# Patient Record
Sex: Male | Born: 1995 | Race: Black or African American | Hispanic: No | Marital: Single | Smoking: Former smoker
Health system: Southern US, Community
[De-identification: ages and names within clinical notes are randomized; demographics above are authoritative.]

## PROBLEM LIST (undated history)

## (undated) DIAGNOSIS — Z720 Tobacco use: Secondary | ICD-10-CM

---
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
## 2017-10-28 ENCOUNTER — Emergency Department (HOSPITAL_COMMUNITY): Payer: No Typology Code available for payment source

## 2017-10-28 ENCOUNTER — Encounter (HOSPITAL_COMMUNITY): Payer: Self-pay | Admitting: Emergency Medicine

## 2017-10-28 ENCOUNTER — Emergency Department (HOSPITAL_COMMUNITY)
Admission: EM | Admit: 2017-10-28 | Discharge: 2017-10-29 | Disposition: A | Discharge status: Home / Self Care | Payer: No Typology Code available for payment source | Attending: Emergency Medicine | Admitting: Emergency Medicine

## 2017-10-28 ENCOUNTER — Other Ambulatory Visit: Payer: Self-pay

## 2017-10-28 DIAGNOSIS — F17210 Nicotine dependence, cigarettes, uncomplicated: Secondary | ICD-10-CM | POA: Insufficient documentation

## 2017-10-28 DIAGNOSIS — R0781 Pleurodynia: Secondary | ICD-10-CM | POA: Diagnosis present

## 2017-10-28 NOTE — ED Notes (Signed)
Pt eating poptarts in waiting room.
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
## 2018-03-06 ENCOUNTER — Encounter (HOSPITAL_COMMUNITY): Payer: Self-pay | Admitting: Emergency Medicine

## 2018-03-06 DIAGNOSIS — F1290 Cannabis use, unspecified, uncomplicated: Secondary | ICD-10-CM | POA: Diagnosis present

## 2018-03-06 DIAGNOSIS — S7401XA Injury of sciatic nerve at hip and thigh level, right leg, initial encounter: Secondary | ICD-10-CM | POA: Diagnosis present

## 2018-03-06 DIAGNOSIS — S32461A Displaced associated transverse-posterior fracture of right acetabulum, initial encounter for closed fracture: Secondary | ICD-10-CM | POA: Diagnosis present

## 2018-03-06 DIAGNOSIS — F1910 Other psychoactive substance abuse, uncomplicated: Secondary | ICD-10-CM | POA: Diagnosis present

## 2018-03-06 DIAGNOSIS — F17210 Nicotine dependence, cigarettes, uncomplicated: Secondary | ICD-10-CM | POA: Diagnosis present

## 2018-03-06 DIAGNOSIS — S32401A Unspecified fracture of right acetabulum, initial encounter for closed fracture: Secondary | ICD-10-CM

## 2018-03-06 DIAGNOSIS — M21371 Foot drop, right foot: Secondary | ICD-10-CM | POA: Diagnosis present

## 2018-03-06 DIAGNOSIS — M87251 Osteonecrosis due to previous trauma, right femur: Secondary | ICD-10-CM | POA: Diagnosis present

## 2018-03-06 DIAGNOSIS — Y92410 Unspecified street and highway as the place of occurrence of the external cause: Secondary | ICD-10-CM

## 2018-03-06 DIAGNOSIS — D62 Acute posthemorrhagic anemia: Secondary | ICD-10-CM | POA: Diagnosis present

## 2018-03-06 DIAGNOSIS — S73014A Posterior dislocation of right hip, initial encounter: Secondary | ICD-10-CM | POA: Diagnosis present

## 2018-03-06 HISTORY — PX: ORIF ACETABULAR FRACTURE: SHX5029

## 2018-03-07 ENCOUNTER — Encounter (HOSPITAL_COMMUNITY): Payer: Self-pay | Admitting: Orthopedic Surgery

## 2018-03-07 DIAGNOSIS — S32461A Displaced associated transverse-posterior fracture of right acetabulum, initial encounter for closed fracture: Secondary | ICD-10-CM | POA: Insufficient documentation

## 2018-03-07 DIAGNOSIS — S32401A Unspecified fracture of right acetabulum, initial encounter for closed fracture: Secondary | ICD-10-CM | POA: Insufficient documentation

## 2018-03-07 DIAGNOSIS — F17210 Nicotine dependence, cigarettes, uncomplicated: Secondary | ICD-10-CM | POA: Insufficient documentation

## 2018-03-07 DIAGNOSIS — S73004A Unspecified dislocation of right hip, initial encounter: Secondary | ICD-10-CM | POA: Insufficient documentation

## 2018-03-07 DIAGNOSIS — Z79899 Other long term (current) drug therapy: Secondary | ICD-10-CM | POA: Insufficient documentation

## 2018-03-10 ENCOUNTER — Encounter: Payer: Self-pay | Admitting: Radiation Oncology

## 2018-03-11 ENCOUNTER — Encounter (HOSPITAL_COMMUNITY): Payer: Self-pay | Admitting: Orthopedic Surgery

## 2018-03-13 ENCOUNTER — Encounter (HOSPITAL_COMMUNITY): Payer: Self-pay | Admitting: Orthopedic Surgery

## 2018-03-25 ENCOUNTER — Encounter (HOSPITAL_COMMUNITY): Payer: Self-pay

## 2018-03-25 DIAGNOSIS — Z5320 Procedure and treatment not carried out because of patient's decision for unspecified reasons: Secondary | ICD-10-CM | POA: Insufficient documentation

## 2018-03-25 DIAGNOSIS — G8911 Acute pain due to trauma: Secondary | ICD-10-CM | POA: Insufficient documentation

## 2018-03-25 DIAGNOSIS — F17210 Nicotine dependence, cigarettes, uncomplicated: Secondary | ICD-10-CM | POA: Insufficient documentation

## 2018-03-26 DIAGNOSIS — G8918 Other acute postprocedural pain: Secondary | ICD-10-CM | POA: Insufficient documentation

## 2018-03-26 DIAGNOSIS — Z4802 Encounter for removal of sutures: Secondary | ICD-10-CM | POA: Insufficient documentation

## 2018-03-26 DIAGNOSIS — R2241 Localized swelling, mass and lump, right lower limb: Secondary | ICD-10-CM | POA: Insufficient documentation

## 2021-08-19 ENCOUNTER — Emergency Department (HOSPITAL_COMMUNITY)
Admission: EM | Admit: 2021-08-19 | Discharge: 2021-08-19 | Disposition: A | Discharge status: Home / Self Care | Payer: Medicaid Other | Attending: Emergency Medicine | Admitting: Emergency Medicine

## 2021-08-19 ENCOUNTER — Encounter (HOSPITAL_COMMUNITY): Payer: Self-pay

## 2021-08-19 ENCOUNTER — Other Ambulatory Visit: Payer: Self-pay

## 2021-08-19 DIAGNOSIS — Z20822 Contact with and (suspected) exposure to covid-19: Secondary | ICD-10-CM | POA: Diagnosis not present

## 2021-08-19 DIAGNOSIS — M549 Dorsalgia, unspecified: Secondary | ICD-10-CM | POA: Diagnosis present

## 2021-08-19 DIAGNOSIS — R1110 Vomiting, unspecified: Secondary | ICD-10-CM | POA: Diagnosis not present

## 2021-08-19 DIAGNOSIS — Z5321 Procedure and treatment not carried out due to patient leaving prior to being seen by health care provider: Secondary | ICD-10-CM | POA: Insufficient documentation

## 2021-08-19 LAB — COMPREHENSIVE METABOLIC PANEL WITH GFR
ALT: 15 U/L (ref 0–44)
AST: 22 U/L (ref 15–41)
Albumin: 3.7 g/dL (ref 3.5–5.0)
Alkaline Phosphatase: 68 U/L (ref 38–126)
Anion gap: 6 (ref 5–15)
BUN: 12 mg/dL (ref 6–20)
CO2: 25 mmol/L (ref 22–32)
Calcium: 8.8 mg/dL — ABNORMAL LOW (ref 8.9–10.3)
Chloride: 106 mmol/L (ref 98–111)
Creatinine, Ser: 1.32 mg/dL — ABNORMAL HIGH (ref 0.61–1.24)
GFR, Estimated: >60 mL/min (ref >60)
Glucose, Bld: 119 mg/dL — ABNORMAL HIGH (ref 70–99)
Potassium: 4 mmol/L (ref 3.5–5.1)
Sodium: 137 mmol/L (ref 135–145)
Total Bilirubin: 0.5 mg/dL (ref 0.3–1.2)
Total Protein: 7.1 g/dL (ref 6.5–8.1)

## 2021-08-19 LAB — CBC WITH DIFFERENTIAL/PLATELET
Abs Immature Granulocytes: 0.02 K/uL (ref 0.00–0.07)
Basophils Absolute: 0 K/uL (ref 0.0–0.1)
Basophils Relative: 0 %
Eosinophils Absolute: 0 K/uL (ref 0.0–0.5)
Eosinophils Relative: 0 %
HCT: 47.4 % (ref 39.0–52.0)
Hemoglobin: 15.9 g/dL (ref 13.0–17.0)
Immature Granulocytes: 0 %
Lymphocytes Relative: 13 %
Lymphs Abs: 0.7 K/uL (ref 0.7–4.0)
MCH: 31.2 pg (ref 26.0–34.0)
MCHC: 33.5 g/dL (ref 30.0–36.0)
MCV: 92.9 fL (ref 80.0–100.0)
Monocytes Absolute: 0.6 K/uL (ref 0.1–1.0)
Monocytes Relative: 11 %
Neutro Abs: 4.1 K/uL (ref 1.7–7.7)
Neutrophils Relative %: 76 %
Platelets: 144 K/uL — ABNORMAL LOW (ref 150–400)
RBC: 5.1 MIL/uL (ref 4.22–5.81)
RDW: 11.9 % (ref 11.5–15.5)
WBC: 5.4 K/uL (ref 4.0–10.5)
nRBC: 0 % (ref 0.0–0.2)

## 2021-08-19 LAB — URINALYSIS, ROUTINE W REFLEX MICROSCOPIC
Bacteria, UA: NONE SEEN
Bilirubin Urine: NEGATIVE
Glucose, UA: NEGATIVE mg/dL
Ketones, ur: NEGATIVE mg/dL
Leukocytes,Ua: NEGATIVE
Nitrite: NEGATIVE
Protein, ur: NEGATIVE mg/dL
Specific Gravity, Urine: 1.014 (ref 1.005–1.030)
pH: 5 (ref 5.0–8.0)

## 2021-08-19 LAB — LACTIC ACID, PLASMA: Lactic Acid, Venous: 0.6 mmol/L (ref 0.5–1.9)

## 2021-08-19 LAB — RESP PANEL BY RT-PCR (FLU A&B, COVID) ARPGX2
Influenza A by PCR: NEGATIVE
Influenza B by PCR: NEGATIVE
SARS Coronavirus 2 by RT PCR: NEGATIVE

## 2021-08-19 MED ORDER — ACETAMINOPHEN 500 MG PO TABS
1000.0000 mg | ORAL_TABLET | Freq: Once | ORAL | Status: AC
  Administered 2021-08-19: 1000 mg via ORAL
  Filled 2021-08-19: qty 2

## 2021-08-19 NOTE — ED Notes (Signed)
Pt didn't answer when called for recheck vitals x2

## 2021-08-19 NOTE — ED Triage Notes (Signed)
Pt c/o back x 3-4 days, vomiting started this am.

## 2021-08-19 NOTE — ED Notes (Signed)
Pt didn't answer when called to recheck vitals  

## 2021-08-19 NOTE — ED Provider Notes (Signed)
Emergency Medicine Provider Triage Evaluation Note  Todd Mclaughlin , a 25 y.o. male  was evaluated in triage.  Pt complains of back pain and vomiting.  States pain has been severe in the lower back over the past 3 days.  He denies injury/trauma/fall.  Today pain seemed worse, has had some vomiting.  Found to be febrile in triage.  Denies known sick contacts or covid exposures.  No cough or URI symptoms.  Denies any generalized body aches, states only his back.  No hx of cancer or IVDU.  No hematuria or dysuria.  Review of Systems  Positive: Back pain, vomiting, fever Negative: Diarrhea, cough, chest pain, SOB  Physical Exam  BP 119/82 (BP Location: Left Arm)   Pulse 98   Temp (!) 101.4 F (38.6 C) (Oral)   Resp (!) 22   SpO2 97%   Gen:   Awake, alert, warm to touch Resp:  Normal effort MSK:   Moves extremities without difficulty  Other:  Appears uncomfortable, no deformity of the back, no skin changes, ambulatory without difficulty  Medical Decision Making  Medically screening exam initiated at 2:16 AM.  Appropriate orders placed.  Clance Baquero was informed that the remainder of the evaluation will be completed by another provider, this initial triage assessment does not replace that evaluation, and the importance of remaining in the ED until their evaluation is complete.  Febrile in triage to 101.9F.   Denies cancer or IVDU.  No red flag symptoms at present but given fever, will obtain labs, covid screen.   Garlon Hatchet, PA-C 08/19/21 3545    Geoffery Lyons, MD 08/19/21 [phone]

## 2021-08-22 ENCOUNTER — Inpatient Hospital Stay (HOSPITAL_COMMUNITY)
Admission: EM | Admit: 2021-08-22 | Discharge: 2021-08-25 | DRG: 871 | Disposition: A | Discharge status: Home / Self Care | Payer: Medicaid Other | Attending: Internal Medicine | Admitting: Internal Medicine

## 2021-08-22 ENCOUNTER — Other Ambulatory Visit: Payer: Self-pay

## 2021-08-22 DIAGNOSIS — M549 Dorsalgia, unspecified: Secondary | ICD-10-CM | POA: Diagnosis present

## 2021-08-22 DIAGNOSIS — Z720 Tobacco use: Secondary | ICD-10-CM | POA: Diagnosis not present

## 2021-08-22 DIAGNOSIS — A419 Sepsis, unspecified organism: Principal | ICD-10-CM | POA: Diagnosis present

## 2021-08-22 DIAGNOSIS — Z20822 Contact with and (suspected) exposure to covid-19: Secondary | ICD-10-CM | POA: Diagnosis present

## 2021-08-22 DIAGNOSIS — J189 Pneumonia, unspecified organism: Secondary | ICD-10-CM | POA: Diagnosis present

## 2021-08-22 DIAGNOSIS — R0602 Shortness of breath: Secondary | ICD-10-CM

## 2021-08-22 DIAGNOSIS — D696 Thrombocytopenia, unspecified: Secondary | ICD-10-CM | POA: Diagnosis present

## 2021-08-22 DIAGNOSIS — N179 Acute kidney failure, unspecified: Secondary | ICD-10-CM | POA: Diagnosis present

## 2021-08-22 DIAGNOSIS — F17210 Nicotine dependence, cigarettes, uncomplicated: Secondary | ICD-10-CM | POA: Diagnosis present

## 2021-08-22 DIAGNOSIS — J90 Pleural effusion, not elsewhere classified: Secondary | ICD-10-CM | POA: Diagnosis present

## 2021-08-22 HISTORY — DX: Tobacco use: Z72.0

## 2021-08-22 MED ORDER — ACETAMINOPHEN 500 MG PO TABS
1000.0000 mg | ORAL_TABLET | Freq: Once | ORAL | Status: AC
  Administered 2021-08-23: 1000 mg via ORAL
  Filled 2021-08-22: qty 2

## 2021-08-22 NOTE — ED Triage Notes (Signed)
Pt c/o lower back pain

## 2021-08-22 NOTE — ED Provider Notes (Addendum)
Emergency Medicine Provider Triage Evaluation Note  Todd Mclaughlin , a 25 y.o. male  was evaluated in triage.  Pt complains of right flank pain.  Patient reports that about 4 days ago he was having low back pain and came to the emergency department was noted to be febrile then but did not stay for a full evaluation.,  Reports earlier today he started having more severe pain but it had moved up to his right flank.  No associated dysuria or hematuria.  He also reports that he has been coughing up phlegm.  Had a negative COVID test when he was seen a few days ago.  Denies history of IV drug use.  Review of Systems  Positive: Back pain, flank pain, cough, fever, nausea Negative: Vomiting, abdominal pain  Physical Exam  BP (!) 94/56 (BP Location: Right Arm)   Pulse (!) 101   Temp (!) 102.2 F (39 C) (Oral)   Resp 16   Ht 5\' 6"  (1.676 m)   Wt 68 kg   SpO2 90%   BMI 24.21 kg/m  Gen:   Awake, appears very uncomfortable Resp:  Normal effort, some discomfort with inspiration Abdomen: Focal tenderness over the right flank,, no anterior abdominal tenderness MSK:   Moves extremities without difficulty, no midline low back pain Other:    Medical Decision Making  Medically screening exam initiated at 11:58 PM.  Appropriate orders placed.  Crixus Mcaulay was informed that the remainder of the evaluation will be completed by another provider, this initial triage assessment does not replace that evaluation, and the importance of remaining in the ED until their evaluation is complete.  Patient meets sepsis criteria, febrile to 102.2, blood pressure is a bit soft at 94/56, notified triage nurse that patient will need acute bed when available for more immediate evaluation.      Lowella Curb, PA-C 08/23/21 0007    10/23/21, MD 08/23/21 [phone]

## 2021-08-23 ENCOUNTER — Emergency Department (HOSPITAL_COMMUNITY): Payer: Medicaid Other

## 2021-08-23 ENCOUNTER — Encounter (HOSPITAL_COMMUNITY): Payer: Self-pay | Admitting: Emergency Medicine

## 2021-08-23 DIAGNOSIS — J90 Pleural effusion, not elsewhere classified: Secondary | ICD-10-CM | POA: Diagnosis present

## 2021-08-23 DIAGNOSIS — Z20822 Contact with and (suspected) exposure to covid-19: Secondary | ICD-10-CM | POA: Diagnosis present

## 2021-08-23 DIAGNOSIS — M549 Dorsalgia, unspecified: Secondary | ICD-10-CM | POA: Diagnosis present

## 2021-08-23 DIAGNOSIS — A419 Sepsis, unspecified organism: Secondary | ICD-10-CM | POA: Diagnosis not present

## 2021-08-23 DIAGNOSIS — D696 Thrombocytopenia, unspecified: Secondary | ICD-10-CM | POA: Diagnosis present

## 2021-08-23 DIAGNOSIS — F17210 Nicotine dependence, cigarettes, uncomplicated: Secondary | ICD-10-CM | POA: Diagnosis present

## 2021-08-23 DIAGNOSIS — Z720 Tobacco use: Secondary | ICD-10-CM | POA: Diagnosis not present

## 2021-08-23 DIAGNOSIS — J189 Pneumonia, unspecified organism: Secondary | ICD-10-CM | POA: Diagnosis present

## 2021-08-23 DIAGNOSIS — N179 Acute kidney failure, unspecified: Secondary | ICD-10-CM | POA: Diagnosis present

## 2021-08-23 LAB — COMPREHENSIVE METABOLIC PANEL
ALT: 16 U/L (ref 0–44)
AST: 24 U/L (ref 15–41)
Albumin: 3.3 g/dL — ABNORMAL LOW (ref 3.5–5.0)
Alkaline Phosphatase: 63 U/L (ref 38–126)
Anion gap: 10 (ref 5–15)
BUN: 9 mg/dL (ref 6–20)
CO2: 23 mmol/L (ref 22–32)
Calcium: 8.8 mg/dL — ABNORMAL LOW (ref 8.9–10.3)
Chloride: 102 mmol/L (ref 98–111)
Creatinine, Ser: 0.96 mg/dL (ref 0.61–1.24)
GFR, Estimated: >60 mL/min (ref >60)
Glucose, Bld: 115 mg/dL — ABNORMAL HIGH (ref 70–99)
Potassium: 3.5 mmol/L (ref 3.5–5.1)
Sodium: 135 mmol/L (ref 135–145)
Total Bilirubin: 0.6 mg/dL (ref 0.3–1.2)
Total Protein: 7.1 g/dL (ref 6.5–8.1)

## 2021-08-23 LAB — RESP PANEL BY RT-PCR (FLU A&B, COVID) ARPGX2
Influenza A by PCR: NEGATIVE
Influenza B by PCR: NEGATIVE
SARS Coronavirus 2 by RT PCR: NEGATIVE

## 2021-08-23 LAB — CBC WITH DIFFERENTIAL/PLATELET
Abs Immature Granulocytes: 0.03 10*3/uL (ref 0.00–0.07)
Basophils Absolute: 0 10*3/uL (ref 0.0–0.1)
Basophils Relative: 0 %
Eosinophils Absolute: 0 10*3/uL (ref 0.0–0.5)
Eosinophils Relative: 0 %
HCT: 44.6 % (ref 39.0–52.0)
Hemoglobin: 15.4 g/dL (ref 13.0–17.0)
Immature Granulocytes: 0 %
Lymphocytes Relative: 10 %
Lymphs Abs: 1 10*3/uL (ref 0.7–4.0)
MCH: 30.9 pg (ref 26.0–34.0)
MCHC: 34.5 g/dL (ref 30.0–36.0)
MCV: 89.4 fL (ref 80.0–100.0)
Monocytes Absolute: 1.1 10*3/uL — ABNORMAL HIGH (ref 0.1–1.0)
Monocytes Relative: 12 %
Neutro Abs: 7.4 10*3/uL (ref 1.7–7.7)
Neutrophils Relative %: 78 %
Platelets: 99 10*3/uL — ABNORMAL LOW (ref 150–400)
RBC: 4.99 MIL/uL (ref 4.22–5.81)
RDW: 11.7 % (ref 11.5–15.5)
WBC: 9.5 10*3/uL (ref 4.0–10.5)
nRBC: 0 % (ref 0.0–0.2)

## 2021-08-23 LAB — URINALYSIS, ROUTINE W REFLEX MICROSCOPIC
Bacteria, UA: NONE SEEN
Bilirubin Urine: NEGATIVE
Glucose, UA: NEGATIVE mg/dL
Hgb urine dipstick: NEGATIVE
Ketones, ur: 5 mg/dL — AB
Leukocytes,Ua: NEGATIVE
Nitrite: NEGATIVE
Protein, ur: 30 mg/dL — AB
Specific Gravity, Urine: 1.021 (ref 1.005–1.030)
pH: 5 (ref 5.0–8.0)

## 2021-08-23 LAB — PROTIME-INR
INR: 1.1 (ref 0.8–1.2)
Prothrombin Time: 13.7 seconds (ref 11.4–15.2)

## 2021-08-23 LAB — LACTIC ACID, PLASMA: Lactic Acid, Venous: 0.8 mmol/L (ref 0.5–1.9)

## 2021-08-23 LAB — RAPID URINE DRUG SCREEN, HOSP PERFORMED
Amphetamines: NOT DETECTED
Barbiturates: NOT DETECTED
Benzodiazepines: NOT DETECTED
Cocaine: NOT DETECTED
Opiates: NOT DETECTED
Tetrahydrocannabinol: POSITIVE — AB

## 2021-08-23 LAB — HIV ANTIBODY (ROUTINE TESTING W REFLEX): HIV Screen 4th Generation wRfx: NONREACTIVE

## 2021-08-23 LAB — APTT: aPTT: 30 seconds (ref 24–36)

## 2021-08-23 LAB — STREP PNEUMONIAE URINARY ANTIGEN: Strep Pneumo Urinary Antigen: NEGATIVE

## 2021-08-23 LAB — PROCALCITONIN: Procalcitonin: <0.1 ng/mL

## 2021-08-23 MED ORDER — LIDOCAINE 5 % EX PTCH
1.0000 | MEDICATED_PATCH | CUTANEOUS | Status: DC
  Administered 2021-08-23 – 2021-08-25 (×3): 1 via TRANSDERMAL
  Filled 2021-08-23 (×4): qty 1

## 2021-08-23 MED ORDER — ALBUTEROL SULFATE (2.5 MG/3ML) 0.083% IN NEBU
2.5000 mg | INHALATION_SOLUTION | Freq: Four times a day (QID) | RESPIRATORY_TRACT | Status: DC | PRN
  Administered 2021-08-23: 2.5 mg via RESPIRATORY_TRACT
  Filled 2021-08-23: qty 3

## 2021-08-23 MED ORDER — IOHEXOL 350 MG/ML SOLN
50.0000 mL | Freq: Once | INTRAVENOUS | Status: AC | PRN
  Administered 2021-08-23: 50 mL via INTRAVENOUS

## 2021-08-23 MED ORDER — SODIUM CHLORIDE 0.9 % IV SOLN: Freq: Once | INTRAVENOUS | Status: AC

## 2021-08-23 MED ORDER — HYDROCOD POLST-CPM POLST ER 10-8 MG/5ML PO SUER
5.0000 mL | Freq: Two times a day (BID) | ORAL | Status: DC | PRN
  Administered 2021-08-24 (×2): 5 mL via ORAL
  Filled 2021-08-23 (×2): qty 5

## 2021-08-23 MED ORDER — SODIUM CHLORIDE 0.9 % IV SOLN: 500.0000 mg | INTRAVENOUS | Status: DC

## 2021-08-23 MED ORDER — GUAIFENESIN ER 600 MG PO TB12
600.0000 mg | ORAL_TABLET | Freq: Two times a day (BID) | ORAL | Status: DC
  Administered 2021-08-23 – 2021-08-25 (×5): 600 mg via ORAL
  Filled 2021-08-23 (×5): qty 1

## 2021-08-23 MED ORDER — SODIUM CHLORIDE 0.9 % IV SOLN
2.0000 g | INTRAVENOUS | Status: DC
  Administered 2021-08-24 – 2021-08-25 (×2): 2 g via INTRAVENOUS
  Filled 2021-08-23 (×2): qty 20

## 2021-08-23 MED ORDER — SODIUM CHLORIDE 0.9 % IV SOLN
1.0000 g | Freq: Once | INTRAVENOUS | Status: AC
  Administered 2021-08-23: 1 g via INTRAVENOUS
  Filled 2021-08-23: qty 10

## 2021-08-23 MED ORDER — LACTATED RINGERS IV BOLUS
30.0000 mL/kg | Freq: Once | INTRAVENOUS | Status: AC
  Administered 2021-08-23: 2040 mL via INTRAVENOUS

## 2021-08-23 MED ORDER — HYDROCOD POLST-CPM POLST ER 10-8 MG/5ML PO SUER
5.0000 mL | Freq: Once | ORAL | Status: AC
  Administered 2021-08-23: 5 mL via ORAL
  Filled 2021-08-23: qty 5

## 2021-08-23 MED ORDER — SODIUM CHLORIDE 0.9 % IV SOLN
500.0000 mg | Freq: Once | INTRAVENOUS | Status: AC
  Administered 2021-08-23: 500 mg via INTRAVENOUS
  Filled 2021-08-23: qty 500

## 2021-08-23 MED ORDER — ENOXAPARIN SODIUM 40 MG/0.4ML IJ SOSY
40.0000 mg | PREFILLED_SYRINGE | INTRAMUSCULAR | Status: DC
  Administered 2021-08-23 – 2021-08-24 (×2): 40 mg via SUBCUTANEOUS
  Filled 2021-08-23 (×3): qty 0.4

## 2021-08-23 MED ORDER — AZITHROMYCIN 500 MG IV SOLR
500.0000 mg | INTRAVENOUS | Status: DC
  Administered 2021-08-24 – 2021-08-25 (×2): 500 mg via INTRAVENOUS
  Filled 2021-08-23 (×2): qty 500

## 2021-08-23 NOTE — ED Notes (Signed)
Pt ambulated with pulse oxymetry. Distance 59ft Pt reported weakness, SPO2 95-98% the whole time, room air, denied SOB.

## 2021-08-23 NOTE — H&P (Addendum)
History and Physical    Todd Mclaughlin UXL:[id number] DOB: May 13, 1996 DOA: 08/22/2021  Referring MD/NP/PA: Lyda PeroneJared Gardner, MD PCP: Patient, No Pcp Per (Inactive)  Patient coming from: Home  Chief Complaint: Back pain  I have personally briefly reviewed patient's old medical records in [facility]   HPI: Todd Mclaughlin is a 25 y.o. male with medical history significant of tobacco abuse presents with complaints of back pain over the last week.  Pain has been severe on the right mid to lower back.  Denies having any fall or trauma to the onset symptoms.  Reported associated symptoms of productive cough with greenish sputum production, intermittent feelings of shortness of breath, fevers up to 101.4 F, and constipation.  At home he had tried Robitussin for the cough and ibuprofen for the back with only mild temporary relief in symptoms.  Patient also reported having an episode of vomiting earlier in the week after taking some pain medication.  He used to smoke a pack of cigarettes per day on average, but decided to quit 2-3 weeks ago as he felt it was time.  Previously reported smoking 1 pack cigarettes per day on average for over 10 years.  He does also admit to intermittent marijuana use, but does not drink alcohol.  To his knowledge he has not had any recent sick contacts or had similar symptoms like this previously in the past.  Denies any recent travel, leg swelling, calf pain, history of clotting, chest pain, or recurrence of vomiting.  ED Course: Upon admission to the emergency department patient was noted to be febrile up to 102.2 F, pulse 64-1 01, respirations 16-22, blood pressure 94/56 and 112/72, and O2 saturations maintained on room air.  Labs significant for WBC 9.5, platelets 99 and lactic acid 0.8.  CT angiogram of the chest significant for for a multifocal pneumonia with associated right pleural effusion and in large mediastinal lymph nodes, but did not note any concern for  pulmonary embolus.  Urinalysis showed no signs of infection.  Influenza and COVID-19 screening was negative.  Sepsis protocol has been initiated with full fluid bolus 2.04 L of lactated ringers and blood cultures obtained.  Patient has been given acetaminophen 1000 mg p.o., Rocephin 1 g, azithromycin 500 mg.  Review of Systems  Constitutional:  Positive for fever and malaise/fatigue.  HENT:  Negative for congestion and sinus pain.   Eyes:  Negative for double vision and photophobia.  Respiratory:  Positive for cough, sputum production and shortness of breath.   Cardiovascular:  Negative for chest pain and leg swelling.  Gastrointestinal:  Positive for constipation. Negative for abdominal pain, nausea and vomiting.  Genitourinary:  Negative for dysuria and frequency.  Musculoskeletal:  Positive for back pain.  Skin:  Negative for rash.  Neurological:  Negative for focal weakness and loss of consciousness.  Psychiatric/Behavioral:  Positive for substance abuse. The patient has insomnia.    Past Medical History:  Diagnosis Date   Tobacco abuse     History reviewed. No pertinent surgical history.   reports that he has quit smoking. His smoking use included cigarettes. He smoked an average of 1 pack per day. He has never used smokeless tobacco. He reports current drug use. Drug: Marijuana. He reports that he does not drink alcohol.  No Known Allergies  Family History  Problem Relation Age of Onset   Diabetes Cousin     Prior to Admission medications   Medication Sig Start Date End Date Taking? Authorizing Provider  ibuprofen (ADVIL) 200 MG tablet Take 200-400 mg by mouth every 6 (six) hours as needed for moderate pain or headache.   Yes [provider]    Physical Exam:  Constitutional: Young male who appears to be acutely sick but in no acute distress Vitals:   08/23/21 0530 08/23/21 0600 08/23/21 0615 08/23/21 0645  BP: 107/76 112/72 109/78 102/74  Pulse: 73 67 64 77   Resp: 20 20 (!) 22 19  Temp: 98.4 F (36.9 C)     TempSrc:      SpO2: 95% 94% 94% 98%  Weight:      Height:       Eyes: PERRL, lids and conjunctivae normal ENMT: Mucous membranes are moist. Posterior pharynx clear of any exudate or lesions.  Neck: normal, supple, no masses, no thyromegaly Respiratory: Normal effort with rhonchi appreciated in mid to lower lung fields bilaterally.  No significant wheezing appreciated.  O2 saturation currently maintained on room air. Cardiovascular: Regular rate and rhythm, no murmurs / rubs / gallops. No extremity edema. 2+ pedal pulses. No carotid bruits.  Abdomen: no tenderness, no masses palpated. No hepatosplenomegaly. Bowel sounds positive.  Musculoskeletal: no clubbing / cyanosis. No joint deformity upper and lower extremities.  Tenderness palpation of the right lower paraspinal muscle. Skin: no rashes, lesions, ulcers. No induration Neurologic: CN 2-12 grossly intact. Sensation intact, DTR normal. Strength 5/5 in all 4.  Psychiatric: Normal judgment and insight. Alert and oriented x 3. Normal mood.     Labs on Admission: I have personally reviewed following labs and imaging studies  CBC: Recent Labs  Lab 08/19/21 0248 08/23/21 0002  WBC 5.4 9.5  NEUTROABS 4.1 7.4  HGB 15.9 15.4  HCT 47.4 44.6  MCV 92.9 89.4  PLT 144* 99*   Basic Metabolic Panel: Recent Labs  Lab 08/19/21 0248 08/23/21 0002  NA 137 135  K 4.0 3.5  CL 106 102  CO2 25 23  GLUCOSE 119* 115*  BUN 12 9  CREATININE 1.32* 0.96  CALCIUM 8.8* 8.8*   GFR: Estimated Creatinine Clearance: 105.2 mL/min (by C-G formula based on SCr of 0.96 mg/dL). Liver Function Tests: Recent Labs  Lab 08/19/21 0248 08/23/21 0002  AST 22 24  ALT 15 16  ALKPHOS 68 63  BILITOT 0.5 0.6  PROT 7.1 7.1  ALBUMIN 3.7 3.3*   No results for input(s): LIPASE, AMYLASE in the last 168 hours. No results for input(s): AMMONIA in the last 168 hours. Coagulation Profile: No results for  input(s): INR, PROTIME in the last 168 hours. Cardiac Enzymes: No results for input(s): CKTOTAL, CKMB, CKMBINDEX, TROPONINI in the last 168 hours. BNP (last 3 results) No results for input(s): PROBNP in the last 8760 hours. HbA1C: No results for input(s): HGBA1C in the last 72 hours. CBG: No results for input(s): GLUCAP in the last 168 hours. Lipid Profile: No results for input(s): CHOL, HDL, LDLCALC, TRIG, CHOLHDL, LDLDIRECT in the last 72 hours. Thyroid Function Tests: No results for input(s): TSH, T4TOTAL, FREET4, T3FREE, THYROIDAB in the last 72 hours. Anemia Panel: No results for input(s): VITAMINB12, FOLATE, FERRITIN, TIBC, IRON, RETICCTPCT in the last 72 hours. Urine analysis:    Component Value Date/Time   COLORURINE YELLOW 08/23/2021 0549   APPEARANCEUR CLEAR 08/23/2021 0549   LABSPEC 1.021 08/23/2021 0549   PHURINE 5.0 08/23/2021 0549   GLUCOSEU NEGATIVE 08/23/2021 0549   HGBUR NEGATIVE 08/23/2021 0549   BILIRUBINUR NEGATIVE 08/23/2021 0549   KETONESUR 5 (A) 08/23/2021 0549   PROTEINUR  30 (A) 08/23/2021 0549   NITRITE NEGATIVE 08/23/2021 0549   LEUKOCYTESUR NEGATIVE 08/23/2021 0549   Sepsis Labs: Recent Results (from the past 240 hour(s))  Resp Panel by RT-PCR (Flu A&B, Covid) Nasopharyngeal Swab     Status: None   Collection Time: 08/19/21  2:19 AM   Specimen: Nasopharyngeal Swab; Nasopharyngeal(NP) swabs in vial transport medium  Result Value Ref Range Status   SARS Coronavirus 2 by RT PCR NEGATIVE NEGATIVE Final    Comment: (NOTE) SARS-CoV-2 target nucleic acids are NOT DETECTED.  The SARS-CoV-2 RNA is generally detectable in upper respiratory specimens during the acute phase of infection. The lowest concentration of SARS-CoV-2 viral copies this assay can detect is 138 copies/mL. A negative result does not preclude SARS-Cov-2 infection and should not be used as the sole basis for treatment or other patient management decisions. A negative result may occur  with  improper specimen collection/handling, submission of specimen other than nasopharyngeal swab, presence of viral mutation(s) within the areas targeted by this assay, and inadequate number of viral copies(<138 copies/mL). A negative result must be combined with clinical observations, patient history, and epidemiological information. The expected result is Negative.  Fact Sheet for Patients:  BloggerCourse.com  Fact Sheet for Healthcare Providers:  SeriousBroker.it  This test is no t yet approved or cleared by the [place] FDA and  has been authorized for detection and/or diagnosis of SARS-CoV-2 by FDA under an Emergency Use Authorization (EUA). This EUA will remain  in effect (meaning this test can be used) for the duration of the COVID-19 declaration under Section 564(b)(1) of the Act, 21 U.S.C.section 360bbb-3(b)(1), unless the authorization is terminated  or revoked sooner.       Influenza A by PCR NEGATIVE NEGATIVE Final   Influenza B by PCR NEGATIVE NEGATIVE Final    Comment: (NOTE) The Xpert Xpress SARS-CoV-2/FLU/RSV plus assay is intended as an aid in the diagnosis of influenza from Nasopharyngeal swab specimens and should not be used as a sole basis for treatment. Nasal washings and aspirates are unacceptable for Xpert Xpress SARS-CoV-2/FLU/RSV testing.  Fact Sheet for Patients: BloggerCourse.com  Fact Sheet for Healthcare Providers: SeriousBroker.it  This test is not yet approved or cleared by the [place] FDA and has been authorized for detection and/or diagnosis of SARS-CoV-2 by FDA under an Emergency Use Authorization (EUA). This EUA will remain in effect (meaning this test can be used) for the duration of the COVID-19 declaration under Section 564(b)(1) of the Act, 21 U.S.C. section 360bbb-3(b)(1), unless the authorization is terminated  or revoked.  Performed at [facility], [address] [place], [place] [address]   Resp Panel by RT-PCR (Flu A&B, Covid) Nasopharyngeal Swab     Status: None   Collection Time: 08/22/21 11:58 PM   Specimen: Nasopharyngeal Swab; Nasopharyngeal(NP) swabs in vial transport medium  Result Value Ref Range Status   SARS Coronavirus 2 by RT PCR NEGATIVE NEGATIVE Final    Comment: (NOTE) SARS-CoV-2 target nucleic acids are NOT DETECTED.  The SARS-CoV-2 RNA is generally detectable in upper respiratory specimens during the acute phase of infection. The lowest concentration of SARS-CoV-2 viral copies this assay can detect is 138 copies/mL. A negative result does not preclude SARS-Cov-2 infection and should not be used as the sole basis for treatment or other patient management decisions. A negative result may occur with  improper specimen collection/handling, submission of specimen other than nasopharyngeal swab, presence of viral mutation(s) within the areas targeted by this assay,  and inadequate number of viral copies(<138 copies/mL). A negative result must be combined with clinical observations, patient history, and epidemiological information. The expected result is Negative.  Fact Sheet for Patients:  BloggerCourse.com  Fact Sheet for Healthcare Providers:  SeriousBroker.it  This test is no t yet approved or cleared by the [place] FDA and  has been authorized for detection and/or diagnosis of SARS-CoV-2 by FDA under an Emergency Use Authorization (EUA). This EUA will remain  in effect (meaning this test can be used) for the duration of the COVID-19 declaration under Section 564(b)(1) of the Act, 21 U.S.C.section 360bbb-3(b)(1), unless the authorization is terminated  or revoked sooner.       Influenza A by PCR NEGATIVE NEGATIVE Final   Influenza B by PCR NEGATIVE NEGATIVE Final    Comment: (NOTE) The Xpert Xpress  SARS-CoV-2/FLU/RSV plus assay is intended as an aid in the diagnosis of influenza from Nasopharyngeal swab specimens and should not be used as a sole basis for treatment. Nasal washings and aspirates are unacceptable for Xpert Xpress SARS-CoV-2/FLU/RSV testing.  Fact Sheet for Patients: BloggerCourse.com  Fact Sheet for Healthcare Providers: SeriousBroker.it  This test is not yet approved or cleared by the [place] FDA and has been authorized for detection and/or diagnosis of SARS-CoV-2 by FDA under an Emergency Use Authorization (EUA). This EUA will remain in effect (meaning this test can be used) for the duration of the COVID-19 declaration under Section 564(b)(1) of the Act, 21 U.S.C. section 360bbb-3(b)(1), unless the authorization is terminated or revoked.  Performed at [facility], [address], [place], [place] [address]      Radiological Exams on Admission: DG Chest 2 View  Result Date: 08/23/2021 CLINICAL DATA:  Cough, fever EXAM: CHEST - 2 VIEW COMPARISON:  10/28/2017 FINDINGS: Bilateral lower lobe airspace opacities concerning for pneumonia. Heart is normal size. No effusions or acute bony abnormality. IMPRESSION: Bilateral lower lobe opacities compatible with pneumonia. Electronically Signed   By: Charlett Nose M.D.   On: 08/23/2021 00:41   CT Angio Chest PE W and/or Wo Contrast  Result Date: 08/23/2021 CLINICAL DATA:  Chest pain shortness of. EXAM: CT ANGIOGRAPHY CHEST WITH CONTRAST TECHNIQUE: Multidetector CT imaging of the chest was performed using the standard protocol during bolus administration of intravenous contrast. Multiplanar CT image reconstructions and MIPs were obtained to evaluate the vascular anatomy. CONTRAST:  50mL OMNIPAQUE IOHEXOL 350 MG/ML SOLN COMPARISON:  None. FINDINGS: Cardiovascular: The heart is normal in size. No pericardial effusion. The aorta is normal in caliber. No dissection. The  branch vessels are patent. The pulmonary arterial tree is well opacified. No filling defects to suggest. Mediastinum/Nodes: Borderline enlarged mediastinal lymph nodes likely reactive lymphadenopathy. The esophagus is grossly. Lungs/Pleura: Patchy nodular airspace process along with more focal airspace consolidation in the right lower lobe. Small associated right pleural effusion. No worrisome pulmonary lesions. No pneumothorax. Upper Abdomen: No significant findings. Musculoskeletal: No significant bony findings. Review of the MIP images confirms the above findings. IMPRESSION: 1. No CT findings for pulmonary embolism. 2. Normal thoracic aorta. 3. Multifocal pneumonia. 4. Small associated right pleural effusion. 5. Borderline enlarged mediastinal lymph nodes, likely reactive lymphadenopathy. Electronically Signed   By: Rudie Meyer M.D.   On: 08/23/2021 07:12   CT Renal Stone Study  Result Date: 08/23/2021 CLINICAL DATA:  Flank pain.  Kidney stone suspected. EXAM: CT ABDOMEN AND PELVIS WITHOUT CONTRAST TECHNIQUE: Multidetector CT imaging of the abdomen and pelvis was performed following the standard protocol  without IV contrast. COMPARISON:  None. FINDINGS: Lower chest: Multifocal pulmonary opacities in the included lung bases. This appears ground-glass and multinodular, with more confluent consolidation in the dependent right lower lobe and lingula. Heart is normal in size. Hepatobiliary: No focal hepatic abnormality on this unenhanced exam. Decompressed gallbladder. No calcified gallstone. There is no biliary dilatation. Pancreas: No ductal dilatation or inflammation. Spleen: Normal in size without focal abnormality. Adrenals/Urinary Tract: No adrenal nodule. No hydronephrosis or renal calculi. No perinephric edema. No focal renal lesion on this unenhanced exam. Unremarkable urinary bladder. Stomach/Bowel: Detailed bowel assessment is limited in the absence of enteric contrast and paucity of intra-abdominal  fat. Decompressed stomach. There is no small bowel obstruction or inflammation. The appendix is not confidently visualized on the current exam. There is no evidence of appendicitis. Small volume colonic stool. Mild distal colonic redundancy. Vascular/Lymphatic: Normal caliber abdominal aorta. No portal venous or mesenteric gas. No abdominopelvic adenopathy. Reproductive: Prostate is unremarkable. Other: No free air.  No ascites.  No abdominal wall hernia. Musculoskeletal: There are no acute or suspicious osseous abnormalities. IMPRESSION: 1. No renal stones or obstructive uropathy. 2. Multifocal pneumonia in the included lung bases. There is diffuse nodular ground-glass opacity with more confluent consolidation in the dependent right lower lobe and lingula. Electronically Signed   By: Narda Rutherford M.D.   On: 08/23/2021 00:37    EKG: Independently reviewed.  Bilateral infiltrates appreciated in both lungs  Assessment/Plan Sepsis secondary to community-acquired pneumonia: Patient presents with complaints of back pain.  Found to be febrile up to 102.2 F with tachycardia and tachypnea meeting SIRS criteria.  Imaging studies significant for multifocal pneumonia with enlarged mediastinal lymph nodes and no signs of pulmonary embolus.  Lactic acid was reassuring at 0.8, but signs of endorgan damage noted including platelets less than 100,000.  Patient's O2 saturations were otherwise maintained blood cultures have been obtained.  Patient has been started on antibiotics of Rocephin and azithromycin.    -Admit to medical telemetry bed -Follow-up blood cultures -Check procalcitonin, HIV, PT/INR, APTT -Incentive spirometry and flutter valve -Continue Rocephin and azithromycin -Tussionex as needed for cough -Tylenol as needed for fever  Back pain: Acute.  Patient complains of severe right-sided back pain.  Tenderness palpation of the right sided paraspinal muscle.  Suspect possibly a muscle strain. -Lidocaine  patch  Thrombocytopenia: Acute.  Platelet count decreased from 141 to 99 today.  Patient without significant history of alcohol use or bleeding.  Suspect related to acute infection -Continue to monitor platelet counts and discontinue Lovenox if platelet count seen to continuing to trend down  Tobacco and marijuana use: Patient reports that he previously smoked a half a pack of cigarettes per day on average and has been smoking for over 10years, but made the decision and quit 2 to 3 weeks ago.  Patient reported intermittent use of marijuana -Encourage patient to continue with cessation of tobacco use as well as use of marijuana  DVT prophylaxis: Lovenox Code Status: Full Family Communication: Declined need to update family Disposition Plan: Home Consults called: None Admission status: Inpatient, more than 2 midnight stay for need of IV antibiotics  Clydie Braun MD [facility]   If 7PM-7AM, please contact night-coverage   08/23/2021, 7:16 AM

## 2021-08-23 NOTE — ED Provider Notes (Signed)
[facility] EMERGENCY DEPARTMENT Provider Note   CSN: [id number] Arrival date & time: 08/22/21  2341     History Chief Complaint  Patient presents with   Back Pain    Todd Mclaughlin is a 25 y.o. male.  The history is provided by the patient.  Back Pain Pain location: right posterior ribcage 8-9 rib. Radiates to:  Does not radiate Pain severity:  Severe Pain is:  Unable to specify Onset quality:  Gradual Duration: days. Timing:  Constant Progression:  Worsening Chronicity:  New Context: not MCA and not MVA   Relieved by:  Nothing Worsened by:  Nothing Ineffective treatments:  None tried Associated symptoms: fever   Associated symptoms: no dysuria, no paresthesias, no tingling, no weakness and no weight loss   Risk factors: not obese       History reviewed. No pertinent past medical history.  There are no problems to display for this patient.   History reviewed. No pertinent surgical history.     History reviewed. No pertinent family history.  Social History   Tobacco Use   Smoking status: Every Day    Packs/day: 1.00    Types: Cigarettes   Smokeless tobacco: Never  Vaping Use   Vaping Use: Never used  Substance Use Topics   Alcohol use: No   Drug use: No    Home Medications Prior to Admission medications   Medication Sig Start Date End Date Taking? Authorizing Provider  ibuprofen (ADVIL) 200 MG tablet Take 200-400 mg by mouth every 6 (six) hours as needed for moderate pain or headache.   Yes [provider]    Allergies    Patient has no known allergies.  Review of Systems   Review of Systems  Constitutional:  Positive for fever. Negative for weight loss.  HENT:  Negative for facial swelling.   Eyes:  Negative for redness.  Respiratory:  Positive for cough and shortness of breath.   Cardiovascular:  Negative for leg swelling.  Gastrointestinal:  Positive for vomiting.  Genitourinary:  Negative for dysuria.   Musculoskeletal:  Positive for arthralgias.  Skin:  Negative for rash.  Neurological:  Negative for tingling, facial asymmetry, weakness and paresthesias.  Psychiatric/Behavioral:  Negative for agitation.   All other systems reviewed and are negative.  Physical Exam Updated Vital Signs BP 107/76 (BP Location: Left Arm)   Pulse 73   Temp 98.4 F (36.9 C)   Resp 20   Ht 5\' 6"  (1.676 m)   Wt 68 kg   SpO2 95%   BMI 24.21 kg/m   Physical Exam Vitals and nursing note reviewed.  Constitutional:      Appearance: Normal appearance. He is not diaphoretic.  HENT:     Head: Normocephalic and atraumatic.     Nose: Nose normal.  Eyes:     Conjunctiva/sclera: Conjunctivae normal.     Pupils: Pupils are equal, round, and reactive to light.  Cardiovascular:     Rate and Rhythm: Normal rate and regular rhythm.     Pulses: Normal pulses.     Heart sounds: Normal heart sounds.  Pulmonary:     Breath sounds: Rales present.  Abdominal:     General: Abdomen is flat. Bowel sounds are normal.     Palpations: Abdomen is soft.     Tenderness: There is no abdominal tenderness. There is no guarding.  Musculoskeletal:        General: Normal range of motion.     Cervical back: Normal  range of motion and neck supple.  Skin:    General: Skin is warm and dry.     Capillary Refill: Capillary refill takes less than 2 seconds.  Neurological:     General: No focal deficit present.     Mental Status: He is alert and oriented to person, place, and time.     Deep Tendon Reflexes: Reflexes normal.  Psychiatric:        Mood and Affect: Mood normal.        Behavior: Behavior normal.    ED Results / Procedures / Treatments   Labs (all labs ordered are listed, but only abnormal results are displayed) Results for orders placed or performed during the hospital encounter of 08/22/21  Resp Panel by RT-PCR (Flu A&B, Covid) Nasopharyngeal Swab   Specimen: Nasopharyngeal Swab; Nasopharyngeal(NP) swabs in  vial transport medium  Result Value Ref Range   SARS Coronavirus 2 by RT PCR NEGATIVE NEGATIVE   Influenza A by PCR NEGATIVE NEGATIVE   Influenza B by PCR NEGATIVE NEGATIVE  Comprehensive metabolic panel  Result Value Ref Range   Sodium 135 135 - 145 mmol/L   Potassium 3.5 3.5 - 5.1 mmol/L   Chloride 102 98 - 111 mmol/L   CO2 23 22 - 32 mmol/L   Glucose, Bld 115 (H) 70 - 99 mg/dL   BUN 9 6 - 20 mg/dL   Creatinine, Ser 4.09 0.61 - 1.24 mg/dL   Calcium 8.8 (L) 8.9 - 10.3 mg/dL   Total Protein 7.1 6.5 - 8.1 g/dL   Albumin 3.3 (L) 3.5 - 5.0 g/dL   AST 24 15 - 41 U/L   ALT 16 0 - 44 U/L   Alkaline Phosphatase 63 38 - 126 U/L   Total Bilirubin 0.6 0.3 - 1.2 mg/dL   GFR, Estimated >81 >19 mL/min   Anion gap 10 5 - 15  CBC with Differential  Result Value Ref Range   WBC 9.5 4.0 - 10.5 K/uL   RBC 4.99 4.22 - 5.81 MIL/uL   Hemoglobin 15.4 13.0 - 17.0 g/dL   HCT 14.7 82.9 - 56.2 %   MCV 89.4 80.0 - 100.0 fL   MCH 30.9 26.0 - 34.0 pg   MCHC 34.5 30.0 - 36.0 g/dL   RDW 13.0 86.5 - 78.4 %   Platelets 99 (L) 150 - 400 K/uL   nRBC 0.0 0.0 - 0.2 %   Neutrophils Relative % 78 %   Neutro Abs 7.4 1.7 - 7.7 K/uL   Lymphocytes Relative 10 %   Lymphs Abs 1.0 0.7 - 4.0 K/uL   Monocytes Relative 12 %   Monocytes Absolute 1.1 (H) 0.1 - 1.0 K/uL   Eosinophils Relative 0 %   Eosinophils Absolute 0.0 0.0 - 0.5 K/uL   Basophils Relative 0 %   Basophils Absolute 0.0 0.0 - 0.1 K/uL   WBC Morphology MORPHOLOGY UNREMARKABLE    RBC Morphology MORPHOLOGY UNREMARKABLE    Smear Review PLATELET COUNT CONFIRMED BY SMEAR    Immature Granulocytes 0 %   Abs Immature Granulocytes 0.03 0.00 - 0.07 K/uL  Lactic acid, plasma  Result Value Ref Range   Lactic Acid, Venous 0.8 0.5 - 1.9 mmol/L  Urinalysis, Routine w reflex microscopic  Result Value Ref Range   Color, Urine YELLOW YELLOW   APPearance CLEAR CLEAR   Specific Gravity, Urine 1.021 1.005 - 1.030   pH 5.0 5.0 - 8.0   Glucose, UA NEGATIVE NEGATIVE  mg/dL   Hgb urine dipstick NEGATIVE  NEGATIVE   Bilirubin Urine NEGATIVE NEGATIVE   Ketones, ur 5 (A) NEGATIVE mg/dL   Protein, ur 30 (A) NEGATIVE mg/dL   Nitrite NEGATIVE NEGATIVE   Leukocytes,Ua NEGATIVE NEGATIVE   RBC / HPF 0-5 0 - 5 RBC/hpf   WBC, UA 0-5 0 - 5 WBC/hpf   Bacteria, UA NONE SEEN NONE SEEN   Squamous Epithelial / LPF 0-5 0 - 5   Mucus PRESENT   Rapid urine drug screen (hospital performed)  Result Value Ref Range   Opiates NONE DETECTED NONE DETECTED   Cocaine NONE DETECTED NONE DETECTED   Benzodiazepines NONE DETECTED NONE DETECTED   Amphetamines NONE DETECTED NONE DETECTED   Tetrahydrocannabinol POSITIVE (A) NONE DETECTED   Barbiturates NONE DETECTED NONE DETECTED   DG Chest 2 View  Result Date: 08/23/2021 CLINICAL DATA:  Cough, fever EXAM: CHEST - 2 VIEW COMPARISON:  10/28/2017 FINDINGS: Bilateral lower lobe airspace opacities concerning for pneumonia. Heart is normal size. No effusions or acute bony abnormality. IMPRESSION: Bilateral lower lobe opacities compatible with pneumonia. Electronically Signed   By: Charlett Nose M.D.   On: 08/23/2021 00:41   CT Renal Stone Study  Result Date: 08/23/2021 CLINICAL DATA:  Flank pain.  Kidney stone suspected. EXAM: CT ABDOMEN AND PELVIS WITHOUT CONTRAST TECHNIQUE: Multidetector CT imaging of the abdomen and pelvis was performed following the standard protocol without IV contrast. COMPARISON:  None. FINDINGS: Lower chest: Multifocal pulmonary opacities in the included lung bases. This appears ground-glass and multinodular, with more confluent consolidation in the dependent right lower lobe and lingula. Heart is normal in size. Hepatobiliary: No focal hepatic abnormality on this unenhanced exam. Decompressed gallbladder. No calcified gallstone. There is no biliary dilatation. Pancreas: No ductal dilatation or inflammation. Spleen: Normal in size without focal abnormality. Adrenals/Urinary Tract: No adrenal nodule. No hydronephrosis or  renal calculi. No perinephric edema. No focal renal lesion on this unenhanced exam. Unremarkable urinary bladder. Stomach/Bowel: Detailed bowel assessment is limited in the absence of enteric contrast and paucity of intra-abdominal fat. Decompressed stomach. There is no small bowel obstruction or inflammation. The appendix is not confidently visualized on the current exam. There is no evidence of appendicitis. Small volume colonic stool. Mild distal colonic redundancy. Vascular/Lymphatic: Normal caliber abdominal aorta. No portal venous or mesenteric gas. No abdominopelvic adenopathy. Reproductive: Prostate is unremarkable. Other: No free air.  No ascites.  No abdominal wall hernia. Musculoskeletal: There are no acute or suspicious osseous abnormalities. IMPRESSION: 1. No renal stones or obstructive uropathy. 2. Multifocal pneumonia in the included lung bases. There is diffuse nodular ground-glass opacity with more confluent consolidation in the dependent right lower lobe and lingula. Electronically Signed   By: Narda Rutherford M.D.   On: 08/23/2021 00:37    EKG None  Radiology DG Chest 2 View  Result Date: 08/23/2021 CLINICAL DATA:  Cough, fever EXAM: CHEST - 2 VIEW COMPARISON:  10/28/2017 FINDINGS: Bilateral lower lobe airspace opacities concerning for pneumonia. Heart is normal size. No effusions or acute bony abnormality. IMPRESSION: Bilateral lower lobe opacities compatible with pneumonia. Electronically Signed   By: Charlett Nose M.D.   On: 08/23/2021 00:41   CT Renal Stone Study  Result Date: 08/23/2021 CLINICAL DATA:  Flank pain.  Kidney stone suspected. EXAM: CT ABDOMEN AND PELVIS WITHOUT CONTRAST TECHNIQUE: Multidetector CT imaging of the abdomen and pelvis was performed following the standard protocol without IV contrast. COMPARISON:  None. FINDINGS: Lower chest: Multifocal pulmonary opacities in the included lung bases. This appears ground-glass and multinodular,  with more confluent  consolidation in the dependent right lower lobe and lingula. Heart is normal in size. Hepatobiliary: No focal hepatic abnormality on this unenhanced exam. Decompressed gallbladder. No calcified gallstone. There is no biliary dilatation. Pancreas: No ductal dilatation or inflammation. Spleen: Normal in size without focal abnormality. Adrenals/Urinary Tract: No adrenal nodule. No hydronephrosis or renal calculi. No perinephric edema. No focal renal lesion on this unenhanced exam. Unremarkable urinary bladder. Stomach/Bowel: Detailed bowel assessment is limited in the absence of enteric contrast and paucity of intra-abdominal fat. Decompressed stomach. There is no small bowel obstruction or inflammation. The appendix is not confidently visualized on the current exam. There is no evidence of appendicitis. Small volume colonic stool. Mild distal colonic redundancy. Vascular/Lymphatic: Normal caliber abdominal aorta. No portal venous or mesenteric gas. No abdominopelvic adenopathy. Reproductive: Prostate is unremarkable. Other: No free air.  No ascites.  No abdominal wall hernia. Musculoskeletal: There are no acute or suspicious osseous abnormalities. IMPRESSION: 1. No renal stones or obstructive uropathy. 2. Multifocal pneumonia in the included lung bases. There is diffuse nodular ground-glass opacity with more confluent consolidation in the dependent right lower lobe and lingula. Electronically Signed   By: Narda RutherfordMelanie  Sanford M.D.   On: 08/23/2021 00:37    Procedures Procedures   Medications Ordered in ED Medications  cefTRIAXone (ROCEPHIN) 1 g in sodium chloride 0.9 % 100 mL IVPB (1 g Intravenous New Bag/Given 08/23/21 0615)  azithromycin (ZITHROMAX) 500 mg in sodium chloride 0.9 % 250 mL IVPB (500 mg Intravenous New Bag/Given 08/23/21 0615)  acetaminophen (TYLENOL) tablet 1,000 mg (1,000 mg Oral Given 08/23/21 0010)  lactated ringers bolus 2,040 mL (2,040 mLs Intravenous New Bag/Given 08/23/21 [id number])    ED Course   I have reviewed the triage vital signs and the nursing notes.  Pertinent labs & imaging results that were available during my care of the patient were reviewed by me and considered in my medical decision making (see chart for details).    MDMet sepsis criteria on arrival, hypoxic to 90% .  It is not covid   Final Clinical Impression(s) / ED Diagnoses Final diagnoses:  Multifocal pneumonia   Admit to medicine  Rx / DC Orders ED Discharge Orders     None        Kina Shiffman, MD 08/23/21 0630

## 2021-08-23 NOTE — ED Notes (Signed)
Pt given sandwich and water per RN 

## 2021-08-23 NOTE — ED Notes (Addendum)
Pt has unproductive cough, unable to give sputum sample at this time. Collection cup given to pt.

## 2021-08-23 NOTE — ED Notes (Signed)
Patient transported to CT 

## 2021-08-24 DIAGNOSIS — D696 Thrombocytopenia, unspecified: Secondary | ICD-10-CM

## 2021-08-24 DIAGNOSIS — A419 Sepsis, unspecified organism: Principal | ICD-10-CM

## 2021-08-24 DIAGNOSIS — Z720 Tobacco use: Secondary | ICD-10-CM

## 2021-08-24 DIAGNOSIS — M549 Dorsalgia, unspecified: Secondary | ICD-10-CM

## 2021-08-24 DIAGNOSIS — J189 Pneumonia, unspecified organism: Secondary | ICD-10-CM

## 2021-08-24 LAB — CBC WITH DIFFERENTIAL/PLATELET
Abs Immature Granulocytes: 0.03 10*3/uL (ref 0.00–0.07)
Basophils Absolute: 0 10*3/uL (ref 0.0–0.1)
Basophils Relative: 0 %
Eosinophils Absolute: 0 10*3/uL (ref 0.0–0.5)
Eosinophils Relative: 0 %
HCT: 41.2 % (ref 39.0–52.0)
Hemoglobin: 14 g/dL (ref 13.0–17.0)
Immature Granulocytes: 1 %
Lymphocytes Relative: 32 %
Lymphs Abs: 2.1 10*3/uL (ref 0.7–4.0)
MCH: 30.4 pg (ref 26.0–34.0)
MCHC: 34 g/dL (ref 30.0–36.0)
MCV: 89.4 fL (ref 80.0–100.0)
Monocytes Absolute: 0.8 10*3/uL (ref 0.1–1.0)
Monocytes Relative: 12 %
Neutro Abs: 3.6 10*3/uL (ref 1.7–7.7)
Neutrophils Relative %: 55 %
Platelets: 121 10*3/uL — ABNORMAL LOW (ref 150–400)
RBC: 4.61 MIL/uL (ref 4.22–5.81)
RDW: 11.9 % (ref 11.5–15.5)
WBC: 6.5 10*3/uL (ref 4.0–10.5)
nRBC: 0 % (ref 0.0–0.2)

## 2021-08-24 LAB — BASIC METABOLIC PANEL
Anion gap: 8 (ref 5–15)
BUN: 9 mg/dL (ref 6–20)
CO2: 25 mmol/L (ref 22–32)
Calcium: 8.8 mg/dL — ABNORMAL LOW (ref 8.9–10.3)
Chloride: 105 mmol/L (ref 98–111)
Creatinine, Ser: 0.81 mg/dL (ref 0.61–1.24)
GFR, Estimated: >60 mL/min (ref >60)
Glucose, Bld: 99 mg/dL (ref 70–99)
Potassium: 3.8 mmol/L (ref 3.5–5.1)
Sodium: 138 mmol/L (ref 135–145)

## 2021-08-24 LAB — LEGIONELLA PNEUMOPHILA SEROGP 1 UR AG: L. pneumophila Serogp 1 Ur Ag: NEGATIVE

## 2021-08-24 MED ORDER — IPRATROPIUM BROMIDE 0.02 % IN SOLN: 0.5000 mg | Freq: Four times a day (QID) | RESPIRATORY_TRACT | Status: DC | PRN

## 2021-08-24 MED ORDER — SODIUM CHLORIDE 0.9 % IV SOLN: INTRAVENOUS | Status: DC

## 2021-08-24 MED ORDER — LEVALBUTEROL HCL 0.63 MG/3ML IN NEBU: 0.6300 mg | INHALATION_SOLUTION | Freq: Four times a day (QID) | RESPIRATORY_TRACT | Status: DC | PRN

## 2021-08-24 MED ORDER — SODIUM CHLORIDE 0.9 % IV SOLN
INTRAVENOUS | Status: AC
Start: 2021-08-24 — End: 2021-08-25

## 2021-08-24 NOTE — Progress Notes (Signed)
Ambulated with pt in the hall on room air.  Sats at rest 99-100% on RA, and sats while ambulating in the hall 96-100% on RA.  Pt tolerated walking well, denied SOB.

## 2021-08-24 NOTE — Plan of Care (Signed)
  Problem: Activity: Goal: Risk for activity intolerance will decrease Outcome: Progressing   

## 2021-08-24 NOTE — Progress Notes (Signed)
PROGRESS NOTE    Todd Mclaughlin  DCV:[id number] DOB: 03/03/1996 DOA: 08/22/2021 PCP: Patient, No Pcp Per (Inactive)   Brief Narrative: The patient is a 25 year old African-American male with past medical history significant for but not limited to tobacco abuse who presents with complaint of back pain last week.  He states that the pain has been severe on the right mid to low back and denies having any trauma or fall.  He also reported symptoms of productive cough with greenish sputum production and intermittent feelings of shortness of breath and fevers up to 101.4 as well as constipation.  At home he tried Robitussin for the cough and ibuprofen for the back with only mild temporary relief in his symptoms.  He also reported an episode of vomiting earlier this week after taking some pain medication.  He used to smoke a pack of cigarettes on average a day but decided to quit 2 to 3 weeks ago also reports he has been smoking 1 pack/day for over 10 years.  He also admitted to intermittent marijuana use which does not drink any alcohol.  To his knowledge he has not had any sick contacts.  Upon arrival to the emergency department he is febrile to 102.2 with a pulse range anywhere from 6401 and respirations that were 16-22 and blood pressure low was 94/56.  Labs are significant for thrombocytopenia.  CTA of the chest was done and showed multifocal pneumonia with associated right pleural effusion and large mediastinal lymph nodes but did not have any concern of any pulmonary embolus.  Influenza COVID-19 screening was negative.  Sepsis protocol was initiated and he was given 2.04 L of lactated ringer boluses as well as acetaminophen and started on antibiotics with Rocephin and azithromycin.  He has been admitted and currently being treated for a multifocal pneumonia.  Assessment & Plan:   Principal Problem:   Sepsis due to pneumonia [facility]) Active Problems:   Back pain   Tobacco use   Thrombocytopenia  (HCC)   Sepsis secondary to community-acquired multifocal pneumonia -Present on admission -Patient complained of back pain and found to have a fever 102.2 with tachycardia and tachypnea meeting SIRS criteria as well as a source for infection given his multifocal pneumonia -CTA of the chest was significant for multifocal pneumonia with enlarged mediastinal lymph nodes and a pleural effusion with no signs of pulm embolus -Lactic acid level was reassuring at 0.8 but he did have a thrombocytopenia -SpO2: 98 % currently on room air -Blood cultures x2 have been obtained -He was initiated on IV ceftriaxone and azithromycin we will continue -Currently admitted to medical telemetry -Given incentive spirometer, flutter valve -HIV testing was negative and procalcitonin level was less than 0.10 -WBC went from 9.5 and trended down to 6.5 -Platelet count is now improving to 121 -We will continue gentle IV fluid hydration with normal saline at 75 mils per hour for another 12 hours -Strep pneumo urine antigen and Legionella pneumophila Serogp 1 Ur Ag Negative  -We will increase the guaifenesin from 600 mg p.o. twice daily to 1200 g p.o. twice daily -We will do an ambulatory home O2 screen prior to discharge and repeat chest x-ray in the a.m.  AKI -Improved -Patient was noted to have a BUN/creatinine of 12/1.3 to on 08/19/2021 and is now trended down to 9/0.81 -We resumed IV fluid hydration as above -Avoid nephrotoxic medications, contrast dyes, hypotension and renally dose medications -Continue monitor and trend renal function carefully and repeat CMP in the a.m.  Back pain -Acute -Complained of right-sided severe back pain -CT renal Stone study done and showed "No renal stones or obstructive uropathy.  Multifocal pneumonia in the included lung bases. There is diffuse nodular ground-glass opacity with more confluent consolidation in the dependent right lower lobe and lingula" -Had tenderness to  palpation at the right side of his paraspinal muscle -Likely a muscle strain and was provided with lidocaine patch and back pain is improving  Thrombocytopenia -Platelet count went from 141 and trended down to 99 and is now 121 -Does not really have a significant alcohol history or history of bleeding -Likely secondary to infection as above -Continue monitor and trend and continue monitor for signs and symptoms bleeding and will discontinue Lovenox if platelet trend continues downward  Tobacco and marijuana use -Patient reported that he previously smoked half a pack of cigarettes per day on average and smoked for over 10 years but quit 2 to 3 weeks ago -Continues to have intermittent marijuana use and UDS was positive for THC -Continue smoking cessation counseling and continue with counseling against marijuana   DVT prophylaxis: Enoxaparin 40 mg sq q24h Code Status: FULL CODE Family Communication: No family present at bedside Disposition Plan: Pending further clinical improvement anticipating discharging home in the next 24 to 48 hours  Status is: Inpatient  Remains inpatient appropriate because:Unsafe d/c plan, IV treatments appropriate due to intensity of illness or inability to take PO, and Inpatient level of care appropriate due to severity of illness  Dispo: The patient is from: Home              Anticipated d/c is to: Home              Patient currently is not medically stable to d/c.   Difficult to place patient No  Consultants:  None  Procedures: None  Antimicrobials:  Anti-infectives (From admission, onward)    Start     Dose/Rate Route Frequency Ordered Stop   08/24/21 1000  azithromycin (ZITHROMAX) 500 mg in sodium chloride 0.9 % 250 mL IVPB        500 mg 250 mL/hr over 60 Minutes Intravenous Every 24 hours 08/23/21 0739 08/28/21 0959   08/24/21 0800  cefTRIAXone (ROCEPHIN) 2 g in sodium chloride 0.9 % 100 mL IVPB        2 g 200 mL/hr over 30 Minutes Intravenous  Every 24 hours 08/23/21 0739 08/28/21 0759   08/24/21 0000  azithromycin (ZITHROMAX) 500 mg in sodium chloride 0.9 % 250 mL IVPB  Status:  Discontinued        500 mg 250 mL/hr over 60 Minutes Intravenous Every 24 hours 08/23/21 0738 08/23/21 0739   08/23/21 0745  cefTRIAXone (ROCEPHIN) 1 g in sodium chloride 0.9 % 100 mL IVPB        1 g 200 mL/hr over 30 Minutes Intravenous  Once 08/23/21 0738 08/23/21 1004   08/23/21 0600  cefTRIAXone (ROCEPHIN) 1 g in sodium chloride 0.9 % 100 mL IVPB        1 g 200 mL/hr over 30 Minutes Intravenous  Once 08/23/21 0559 08/23/21 0648   08/23/21 0600  azithromycin (ZITHROMAX) 500 mg in sodium chloride 0.9 % 250 mL IVPB        500 mg 250 mL/hr over 60 Minutes Intravenous  Once 08/23/21 0559 08/23/21 0715        Subjective: Seen and examined at bedside and thinks that he is doing better and not coughing as much.  Continues  to have some back pain but not as bad.  No chest pain or shortness of breath.  No other concerns or complaints at this time.  Objective: Vitals:   08/23/21 1800 08/23/21 2014 08/24/21 0455 08/24/21 1303  BP: 117/73 (!) 90/55 115/68 127/82  Pulse: 76 63 (!) 53 68  Resp: 13 18 20 16   Temp: 97.9 F (36.6 C) 97.9 F (36.6 C)  98.2 F (36.8 C)  TempSrc: Oral   Oral  SpO2: 100% 97% 93% 98%  Weight:      Height:        Intake/Output Summary (Last 24 hours) at 08/24/2021 1601 Last data filed at 08/24/2021 0400 Gross per 24 hour  Intake 2000 ml  Output --  Net 2000 ml   Filed Weights   08/22/21 2355  Weight: 68 kg   Examination: Physical Exam:  Constitutional: WN/WD thin African-American male currently in NAD and appears calm and comfortable Eyes: Lids and conjunctivae normal, sclerae anicteric  ENMT: External Ears, Nose appear normal. Grossly normal hearing. Neck: Appears normal, supple, no cervical masses, normal ROM, no appreciable thyromegaly: No appreciable JVD Respiratory: Diminished to auscultation bilaterally with  coarse breath sounds, no wheezing, rales, rhonchi or crackles. Normal respiratory effort and patient is not tachypenic. No accessory muscle use.  Unlabored breathing and not wearing supplemental oxygen via nasal cannula Cardiovascular: RRR, no murmurs / rubs / gallops. S1 and S2 auscultated. No extremity edema.  Abdomen: Soft, non-tender, non-distended. No masses palpated. No appreciable hepatosplenomegaly. Bowel sounds positive.  GU: Deferred. Musculoskeletal: No clubbing / cyanosis of digits/nails. No joint deformity upper and lower extremities.  Skin: No rashes, lesions, ulcers on limited skin evaluation. No induration; Warm and dry.  Neurologic: CN 2-12 grossly intact with no focal deficits. Romberg sign and cerebellar reflexes not assessed.  Psychiatric: Normal judgment and insight. Alert and oriented x 3. Normal mood and appropriate affect.   Data Reviewed: I have personally reviewed following labs and imaging studies  CBC: Recent Labs  Lab 08/19/21 0248 08/23/21 0002 08/24/21 0406  WBC 5.4 9.5 6.5  NEUTROABS 4.1 7.4 3.6  HGB 15.9 15.4 14.0  HCT 47.4 44.6 41.2  MCV 92.9 89.4 89.4  PLT 144* 99* 121*   Basic Metabolic Panel: Recent Labs  Lab 08/19/21 0248 08/23/21 0002 08/24/21 0406  NA 137 135 138  K 4.0 3.5 3.8  CL 106 102 105  CO2 25 23 25   GLUCOSE 119* 115* 99  BUN 12 9 9   CREATININE 1.32* 0.96 0.81  CALCIUM 8.8* 8.8* 8.8*   GFR: Estimated Creatinine Clearance: 124.7 mL/min (by C-G formula based on SCr of 0.81 mg/dL). Liver Function Tests: Recent Labs  Lab 08/19/21 0248 08/23/21 0002  AST 22 24  ALT 15 16  ALKPHOS 68 63  BILITOT 0.5 0.6  PROT 7.1 7.1  ALBUMIN 3.7 3.3*   No results for input(s): LIPASE, AMYLASE in the last 168 hours. No results for input(s): AMMONIA in the last 168 hours. Coagulation Profile: Recent Labs  Lab 08/23/21 1118  INR 1.1   Cardiac Enzymes: No results for input(s): CKTOTAL, CKMB, CKMBINDEX, TROPONINI in the last 168  hours. BNP (last 3 results) No results for input(s): PROBNP in the last 8760 hours. HbA1C: No results for input(s): HGBA1C in the last 72 hours. CBG: No results for input(s): GLUCAP in the last 168 hours. Lipid Profile: No results for input(s): CHOL, HDL, LDLCALC, TRIG, CHOLHDL, LDLDIRECT in the last 72 hours. Thyroid Function Tests: No results for input(s):  TSH, T4TOTAL, FREET4, T3FREE, THYROIDAB in the last 72 hours. Anemia Panel: No results for input(s): VITAMINB12, FOLATE, FERRITIN, TIBC, IRON, RETICCTPCT in the last 72 hours. Sepsis Labs: Recent Labs  Lab 08/19/21 0219 08/23/21 0002 08/23/21 1118  PROCALCITON  --   --  <0.10  LATICACIDVEN 0.6 0.8  --     Recent Results (from the past 240 hour(s))  Resp Panel by RT-PCR (Flu A&B, Covid) Nasopharyngeal Swab     Status: None   Collection Time: 08/19/21  2:19 AM   Specimen: Nasopharyngeal Swab; Nasopharyngeal(NP) swabs in vial transport medium  Result Value Ref Range Status   SARS Coronavirus 2 by RT PCR NEGATIVE NEGATIVE Final    Comment: (NOTE) SARS-CoV-2 target nucleic acids are NOT DETECTED.  The SARS-CoV-2 RNA is generally detectable in upper respiratory specimens during the acute phase of infection. The lowest concentration of SARS-CoV-2 viral copies this assay can detect is 138 copies/mL. A negative result does not preclude SARS-Cov-2 infection and should not be used as the sole basis for treatment or other patient management decisions. A negative result may occur with  improper specimen collection/handling, submission of specimen other than nasopharyngeal swab, presence of viral mutation(s) within the areas targeted by this assay, and inadequate number of viral copies(<138 copies/mL). A negative result must be combined with clinical observations, patient history, and epidemiological information. The expected result is Negative.  Fact Sheet for Patients:  BloggerCourse.comhttps://www.fda.gov/media/152166/download  Fact Sheet for  Healthcare Providers:  SeriousBroker.ithttps://www.fda.gov/media/152162/download  This test is no t yet approved or cleared by the [place] FDA and  has been authorized for detection and/or diagnosis of SARS-CoV-2 by FDA under an Emergency Use Authorization (EUA). This EUA will remain  in effect (meaning this test can be used) for the duration of the COVID-19 declaration under Section 564(b)(1) of the Act, 21 U.S.C.section 360bbb-3(b)(1), unless the authorization is terminated  or revoked sooner.       Influenza A by PCR NEGATIVE NEGATIVE Final   Influenza B by PCR NEGATIVE NEGATIVE Final    Comment: (NOTE) The Xpert Xpress SARS-CoV-2/FLU/RSV plus assay is intended as an aid in the diagnosis of influenza from Nasopharyngeal swab specimens and should not be used as a sole basis for treatment. Nasal washings and aspirates are unacceptable for Xpert Xpress SARS-CoV-2/FLU/RSV testing.  Fact Sheet for Patients: BloggerCourse.comhttps://www.fda.gov/media/152166/download  Fact Sheet for Healthcare Providers: SeriousBroker.ithttps://www.fda.gov/media/152162/download  This test is not yet approved or cleared by the [place] FDA and has been authorized for detection and/or diagnosis of SARS-CoV-2 by FDA under an Emergency Use Authorization (EUA). This EUA will remain in effect (meaning this test can be used) for the duration of the COVID-19 declaration under Section 564(b)(1) of the Act, 21 U.S.C. section 360bbb-3(b)(1), unless the authorization is terminated or revoked.  Performed at [facility], [address] [place], [place] [address]   Resp Panel by RT-PCR (Flu A&B, Covid) Nasopharyngeal Swab     Status: None   Collection Time: 08/22/21 11:58 PM   Specimen: Nasopharyngeal Swab; Nasopharyngeal(NP) swabs in vial transport medium  Result Value Ref Range Status   SARS Coronavirus 2 by RT PCR NEGATIVE NEGATIVE Final    Comment: (NOTE) SARS-CoV-2 target nucleic acids are NOT DETECTED.  The SARS-CoV-2 RNA is  generally detectable in upper respiratory specimens during the acute phase of infection. The lowest concentration of SARS-CoV-2 viral copies this assay can detect is 138 copies/mL. A negative result does not preclude SARS-Cov-2 infection and should not be used as  the sole basis for treatment or other patient management decisions. A negative result may occur with  improper specimen collection/handling, submission of specimen other than nasopharyngeal swab, presence of viral mutation(s) within the areas targeted by this assay, and inadequate number of viral copies(<138 copies/mL). A negative result must be combined with clinical observations, patient history, and epidemiological information. The expected result is Negative.  Fact Sheet for Patients:  BloggerCourse.com  Fact Sheet for Healthcare Providers:  SeriousBroker.it  This test is no t yet approved or cleared by the [place] FDA and  has been authorized for detection and/or diagnosis of SARS-CoV-2 by FDA under an Emergency Use Authorization (EUA). This EUA will remain  in effect (meaning this test can be used) for the duration of the COVID-19 declaration under Section 564(b)(1) of the Act, 21 U.S.C.section 360bbb-3(b)(1), unless the authorization is terminated  or revoked sooner.       Influenza A by PCR NEGATIVE NEGATIVE Final   Influenza B by PCR NEGATIVE NEGATIVE Final    Comment: (NOTE) The Xpert Xpress SARS-CoV-2/FLU/RSV plus assay is intended as an aid in the diagnosis of influenza from Nasopharyngeal swab specimens and should not be used as a sole basis for treatment. Nasal washings and aspirates are unacceptable for Xpert Xpress SARS-CoV-2/FLU/RSV testing.  Fact Sheet for Patients: BloggerCourse.com  Fact Sheet for Healthcare Providers: SeriousBroker.it  This test is not yet approved or cleared by the [place] FDA and has been authorized for detection and/or diagnosis of SARS-CoV-2 by FDA under an Emergency Use Authorization (EUA). This EUA will remain in effect (meaning this test can be used) for the duration of the COVID-19 declaration under Section 564(b)(1) of the Act, 21 U.S.C. section 360bbb-3(b)(1), unless the authorization is terminated or revoked.  Performed at [facility], [address], [place], [place] [address]   Culture, blood (routine x 2)     Status: None (Preliminary result)   Collection Time: 08/23/21 12:06 AM   Specimen: BLOOD  Result Value Ref Range Status   Specimen Description BLOOD RIGHT ANTECUBITAL  Final   Special Requests   Final    BOTTLES DRAWN AEROBIC AND ANAEROBIC Blood Culture results may not be optimal due to an inadequate volume of blood received in culture bottles   Culture   Final    NO GROWTH 1 DAY Performed at [facility], [address] [place], [place] [address]    Report Status PENDING  Incomplete  Culture, blood (routine x 2)     Status: None (Preliminary result)   Collection Time: 08/23/21  5:26 AM   Specimen: BLOOD RIGHT HAND  Result Value Ref Range Status   Specimen Description BLOOD RIGHT HAND  Final   Special Requests   Final    BOTTLES DRAWN AEROBIC AND ANAEROBIC Blood Culture results may not be optimal due to an inadequate volume of blood received in culture bottles   Culture   Final    NO GROWTH 1 DAY Performed at [facility], [address], [place], [place] [address]    Report Status PENDING  Incomplete    RN Pressure Injury Documentation:     Estimated body mass index is 24.21 kg/m as calculated from the following:   Height as of this encounter:  (1.676 m).   Weight as of this encounter: 68 kg.  Malnutrition Type:   Malnutrition Characteristics:   Nutrition Interventions:    Radiology Studies: DG Chest 2 View  Result Date:  08/23/2021 CLINICAL DATA:  Cough, fever EXAM: CHEST - 2 VIEW  COMPARISON:  10/28/2017 FINDINGS: Bilateral lower lobe airspace opacities concerning for pneumonia. Heart is normal size. No effusions or acute bony abnormality. IMPRESSION: Bilateral lower lobe opacities compatible with pneumonia. Electronically Signed   By: Charlett Nose M.D.   On: 08/23/2021 00:41   CT Angio Chest PE W and/or Wo Contrast  Result Date: 08/23/2021 CLINICAL DATA:  Chest pain shortness of. EXAM: CT ANGIOGRAPHY CHEST WITH CONTRAST TECHNIQUE: Multidetector CT imaging of the chest was performed using the standard protocol during bolus administration of intravenous contrast. Multiplanar CT image reconstructions and MIPs were obtained to evaluate the vascular anatomy. CONTRAST:  67mL OMNIPAQUE IOHEXOL 350 MG/ML SOLN COMPARISON:  None. FINDINGS: Cardiovascular: The heart is normal in size. No pericardial effusion. The aorta is normal in caliber. No dissection. The branch vessels are patent. The pulmonary arterial tree is well opacified. No filling defects to suggest. Mediastinum/Nodes: Borderline enlarged mediastinal lymph nodes likely reactive lymphadenopathy. The esophagus is grossly. Lungs/Pleura: Patchy nodular airspace process along with more focal airspace consolidation in the right lower lobe. Small associated right pleural effusion. No worrisome pulmonary lesions. No pneumothorax. Upper Abdomen: No significant findings. Musculoskeletal: No significant bony findings. Review of the MIP images confirms the above findings. IMPRESSION: 1. No CT findings for pulmonary embolism. 2. Normal thoracic aorta. 3. Multifocal pneumonia. 4. Small associated right pleural effusion. 5. Borderline enlarged mediastinal lymph nodes, likely reactive lymphadenopathy. Electronically Signed   By: Rudie Meyer M.D.   On: 08/23/2021 07:12   CT Renal Stone Study  Result Date: 08/23/2021 CLINICAL DATA:  Flank pain.  Kidney stone suspected. EXAM: CT ABDOMEN AND PELVIS WITHOUT CONTRAST TECHNIQUE: Multidetector CT imaging  of the abdomen and pelvis was performed following the standard protocol without IV contrast. COMPARISON:  None. FINDINGS: Lower chest: Multifocal pulmonary opacities in the included lung bases. This appears ground-glass and multinodular, with more confluent consolidation in the dependent right lower lobe and lingula. Heart is normal in size. Hepatobiliary: No focal hepatic abnormality on this unenhanced exam. Decompressed gallbladder. No calcified gallstone. There is no biliary dilatation. Pancreas: No ductal dilatation or inflammation. Spleen: Normal in size without focal abnormality. Adrenals/Urinary Tract: No adrenal nodule. No hydronephrosis or renal calculi. No perinephric edema. No focal renal lesion on this unenhanced exam. Unremarkable urinary bladder. Stomach/Bowel: Detailed bowel assessment is limited in the absence of enteric contrast and paucity of intra-abdominal fat. Decompressed stomach. There is no small bowel obstruction or inflammation. The appendix is not confidently visualized on the current exam. There is no evidence of appendicitis. Small volume colonic stool. Mild distal colonic redundancy. Vascular/Lymphatic: Normal caliber abdominal aorta. No portal venous or mesenteric gas. No abdominopelvic adenopathy. Reproductive: Prostate is unremarkable. Other: No free air.  No ascites.  No abdominal wall hernia. Musculoskeletal: There are no acute or suspicious osseous abnormalities. IMPRESSION: 1. No renal stones or obstructive uropathy. 2. Multifocal pneumonia in the included lung bases. There is diffuse nodular ground-glass opacity with more confluent consolidation in the dependent right lower lobe and lingula. Electronically Signed   By: Narda Rutherford M.D.   On: 08/23/2021 00:37    Scheduled Meds:  enoxaparin (LOVENOX) injection  40 mg Subcutaneous Q24H   guaiFENesin  600 mg Oral BID   lidocaine  1 patch Transdermal Q24H   Continuous Infusions:  azithromycin (ZITHROMAX) 500 MG IVPB  (Vial-Mate [employer]) 500 mg (08/24/21 1010)   cefTRIAXone (ROCEPHIN)  IV 2 g (08/24/21 8675)  LOS: 1 day   Kerney Elbe, DO [facility] PAGER is on AMION  If 7PM-7AM, please contact night-coverage [email or web address]

## 2021-08-25 ENCOUNTER — Inpatient Hospital Stay (HOSPITAL_COMMUNITY): Payer: Medicaid Other

## 2021-08-25 LAB — CBC WITH DIFFERENTIAL/PLATELET
Abs Immature Granulocytes: 0.02 10*3/uL (ref 0.00–0.07)
Basophils Absolute: 0 10*3/uL (ref 0.0–0.1)
Basophils Relative: 0 %
Eosinophils Absolute: 0 10*3/uL (ref 0.0–0.5)
Eosinophils Relative: 1 %
HCT: 41.1 % (ref 39.0–52.0)
Hemoglobin: 13.9 g/dL (ref 13.0–17.0)
Immature Granulocytes: 0 %
Lymphocytes Relative: 37 %
Lymphs Abs: 2.6 10*3/uL (ref 0.7–4.0)
MCH: 30.4 pg (ref 26.0–34.0)
MCHC: 33.8 g/dL (ref 30.0–36.0)
MCV: 89.9 fL (ref 80.0–100.0)
Monocytes Absolute: 0.9 10*3/uL (ref 0.1–1.0)
Monocytes Relative: 14 %
Neutro Abs: 3.3 10*3/uL (ref 1.7–7.7)
Neutrophils Relative %: 48 %
Platelets: 155 10*3/uL (ref 150–400)
RBC: 4.57 MIL/uL (ref 4.22–5.81)
RDW: 11.7 % (ref 11.5–15.5)
WBC: 6.9 10*3/uL (ref 4.0–10.5)
nRBC: 0 % (ref 0.0–0.2)

## 2021-08-25 LAB — COMPREHENSIVE METABOLIC PANEL
ALT: 13 U/L (ref 0–44)
AST: 13 U/L — ABNORMAL LOW (ref 15–41)
Albumin: 2.5 g/dL — ABNORMAL LOW (ref 3.5–5.0)
Alkaline Phosphatase: 49 U/L (ref 38–126)
Anion gap: 5 (ref 5–15)
BUN: 8 mg/dL (ref 6–20)
CO2: 27 mmol/L (ref 22–32)
Calcium: 8.4 mg/dL — ABNORMAL LOW (ref 8.9–10.3)
Chloride: 107 mmol/L (ref 98–111)
Creatinine, Ser: 0.88 mg/dL (ref 0.61–1.24)
GFR, Estimated: >60 mL/min (ref >60)
Glucose, Bld: 96 mg/dL (ref 70–99)
Potassium: 3.8 mmol/L (ref 3.5–5.1)
Sodium: 139 mmol/L (ref 135–145)
Total Bilirubin: 0.4 mg/dL (ref 0.3–1.2)
Total Protein: 5.8 g/dL — ABNORMAL LOW (ref 6.5–8.1)

## 2021-08-25 LAB — MAGNESIUM: Magnesium: 2.1 mg/dL (ref 1.7–2.4)

## 2021-08-25 LAB — PHOSPHORUS: Phosphorus: 3.6 mg/dL (ref 2.5–4.6)

## 2021-08-25 MED ORDER — CEFDINIR 300 MG PO CAPS: 300.0000 mg | ORAL_CAPSULE | Freq: Two times a day (BID) | ORAL | 0 refills | Status: AC

## 2021-08-25 MED ORDER — BENZONATATE 100 MG PO CAPS: 100.0000 mg | ORAL_CAPSULE | Freq: Three times a day (TID) | ORAL | 0 refills | Status: AC | PRN

## 2021-08-25 MED ORDER — GUAIFENESIN ER 600 MG PO TB12: 600.0000 mg | ORAL_TABLET | Freq: Two times a day (BID) | ORAL | 0 refills | Status: AC

## 2021-08-25 MED ORDER — AZITHROMYCIN 500 MG PO TABS: 500.0000 mg | ORAL_TABLET | Freq: Every day | ORAL | 0 refills | Status: AC

## 2021-08-25 MED ORDER — LIDOCAINE 5 % EX PTCH: 1.0000 | MEDICATED_PATCH | CUTANEOUS | 0 refills | Status: AC

## 2021-08-25 MED ORDER — ALBUTEROL SULFATE HFA 108 (90 BASE) MCG/ACT IN AERS: 2.0000 | INHALATION_SPRAY | Freq: Four times a day (QID) | RESPIRATORY_TRACT | 2 refills | Status: AC | PRN

## 2021-08-25 NOTE — Plan of Care (Signed)

## 2021-08-25 NOTE — Discharge Summary (Signed)
Physician Discharge Summary  Todd Mclaughlin ZOX:[id number] DOB: [birth year] DOA: 08/22/2021  PCP: Patient, No Pcp Per (Inactive)  Admit date: 08/22/2021 Discharge date: 08/25/2021  Admitted From: Home Disposition: Home  Recommendations for Outpatient Follow-up:  Follow up with PCP in 1-2 weeks Repeat CXR in 3-6 weeks Please obtain CMP/CBC, Mag, Phos in one week Please follow up on the following pending results:  Home Health: No  Equipment/Devices: None    Discharge Condition: Stable  CODE STATUS: FULL CODE Diet recommendation: Regular Diet   Brief/Interim Summary: The patient is a 25 year old African-American male with past medical history significant for but not limited to tobacco abuse who presents with complaint of back pain last week.  He states that the pain has been severe on the right mid to low back and denies having any trauma or fall.  He also reported symptoms of productive cough with greenish sputum production and intermittent feelings of shortness of breath and fevers up to 101.4 as well as constipation.  At home he tried Robitussin for the cough and ibuprofen for the back with only mild temporary relief in his symptoms.  He also reported an episode of vomiting earlier this week after taking some pain medication.  He used to smoke a pack of cigarettes on average a day but decided to quit 2 to 3 weeks ago also reports he has been smoking 1 pack/day for over 10 years.  He also admitted to intermittent marijuana use which does not drink any alcohol.  To his knowledge he has not had any sick contacts.  Upon arrival to the emergency department he is febrile to 102.2 with a pulse range anywhere from 6401 and respirations that were 16-22 and blood pressure low was 94/56.  Labs are significant for thrombocytopenia.  CTA of the chest was done and showed multifocal pneumonia with associated right pleural effusion and large mediastinal lymph nodes but did not have any concern of any pulmonary  embolus.  Influenza COVID-19 screening was negative.  Sepsis protocol was initiated and he was given 2.04 L of lactated ringer boluses as well as acetaminophen and started on antibiotics with Rocephin and azithromycin.  He has been admitted and currently being treated for a multifocal pneumonia.  He improved significantly with treatment and ambulated and did not desaturate.  He was deemed medically stable to be discharged and was transitioned to p.o. antibiotics.  He is given an inhaler and advised to establish and follow-up with a PCP and repeat a chest x-ray in 3 to 6 weeks.  He is understanding and agreeable with plan of care and stable to be discharged at this time.  Discharge Diagnoses:  Principal Problem:   Sepsis due to pneumonia [facility]) Active Problems:   Back pain   Tobacco use   Thrombocytopenia (HCC)  Sepsis secondary to community-acquired multifocal pneumonia -Present on admission -Patient complained of back pain and found to have a fever 102.2 with tachycardia and tachypnea meeting SIRS criteria as well as a source for infection given his multifocal pneumonia -CTA of the chest was significant for multifocal pneumonia with enlarged mediastinal lymph nodes and a pleural effusion with no signs of pulm embolus -Lactic acid level was reassuring at 0.8 but he did have a thrombocytopenia -SpO2: 98 % currently on room air -Blood cultures x2 have been obtained -He was initiated on IV ceftriaxone and azithromycin we will continue while hospitalized and changed to p.o. azithromycin and p.o. cefdinir at discharge -Currently admitted to medical telemetry -Given incentive spirometer, flutter  valve -HIV testing was negative and procalcitonin level was less than 0.10 -WBC went from 9.5 and trended down to 6.5 yesterday and today it is now 6.9 -Platelet count is now improving to 155 -IV fluid hydration stopped -Strep pneumo urine antigen and Legionella pneumophila Serogp 1 Ur Ag Negative  -We will  increase the guaifenesin from 600 mg p.o. twice daily to 1200 g p.o. twice daily while hospitalized and then change back to 600 mg p.o. twice daily -Ambulatory home O2 screen done and patient did not desaturate -Chest x-ray today showed "Minimally improved aeration of the right lower lung. Otherwise, similar appearing right mid and left lower lung heterogeneous airspace opacities worrisome for multifocal infection. A follow-up chest radiograph in 3 to 4 weeks after treatment is recommended to ensure complete resolution."   AKI -Improved -Patient was noted to have a BUN/creatinine of 12/1.3 to on 08/19/2021 and is now trended down to 9/0.81 yesterday and today it is 8/0.8 -We resumed IV fluid hydration as above -Avoid nephrotoxic medications, contrast dyes, hypotension and renally dose medications -Continue monitor and trend renal function carefully and repeat CMP in the a.m.   Back pain -Acute -Complained of right-sided severe back pain -CT renal Stone study done and showed "No renal stones or obstructive uropathy.  Multifocal pneumonia in the included lung bases. There is diffuse nodular ground-glass opacity with more confluent consolidation in the dependent right lower lobe and lingula" -Had tenderness to palpation at the right side of his paraspinal muscle -Likely a muscle strain and was provided with lidocaine patch and back pain is improving, will discharge home with a lidocaine patch   Thrombocytopenia -Platelet count went from 141 and trended down to 99 and is now 121 yesterday and has normalized to 155 today -Does not really have a significant alcohol history or history of bleeding -Likely secondary to infection as above -Continue monitor and trend and continue monitor for signs and symptoms bleeding and will discontinue Lovenox if platelet trend continues downward -Follow-up CBC within 1 week   Tobacco and marijuana use -Patient reported that he previously smoked half a pack of  cigarettes per day on average and smoked for over 10 years but quit 2 to 3 weeks ago -Continues to have intermittent marijuana use and UDS was positive for THC -Continue smoking cessation counseling and continue with counseling against marijuana  Discharge Instructions  Discharge Instructions     Call MD for:  difficulty breathing, headache or visual disturbances   Complete by: As directed    Call MD for:  extreme fatigue   Complete by: As directed    Call MD for:  hives   Complete by: As directed    Call MD for:  persistant dizziness or light-headedness   Complete by: As directed    Call MD for:  persistant nausea and vomiting   Complete by: As directed    Call MD for:  redness, tenderness, or signs of infection (pain, swelling, redness, odor or green/yellow discharge around incision site)   Complete by: As directed    Call MD for:  severe uncontrolled pain   Complete by: As directed    Call MD for:  temperature >100.4   Complete by: As directed    Diet general   Complete by: As directed    Discharge instructions   Complete by: As directed    You were cared for by a hospitalist during your hospital stay. If you have any questions about your discharge medications or  the care you received while you were in the hospital after you are discharged, you can call the unit and ask to speak with the hospitalist on call if the hospitalist that took care of you is not available. Once you are discharged, your primary care physician will handle any further medical issues. Please note that NO REFILLS for any discharge medications will be authorized once you are discharged, as it is imperative that you return to your primary care physician (or establish a relationship with a primary care physician if you do not have one) for your aftercare needs so that they can reassess your need for medications and monitor your lab values.  Follow up with PCP and Repeat CXR in 3-6 weeks. Take all medications as  prescribed. If symptoms change or worsen please return to the ED for evaluation   Increase activity slowly   Complete by: As directed       Allergies as of 08/25/2021   No Known Allergies      Medication List     TAKE these medications    albuterol 108 (90 Base) MCG/ACT inhaler Commonly known as: VENTOLIN HFA Inhale 2 puffs into the lungs every 6 (six) hours as needed for wheezing or shortness of breath.   azithromycin 500 MG tablet Commonly known as: Zithromax Take 1 tablet (500 mg total) by mouth daily for 5 days.   benzonatate 100 MG capsule Commonly known as: Tessalon Perles Take 1 capsule (100 mg total) by mouth 3 (three) times daily as needed for cough.   cefdinir 300 MG capsule Commonly known as: OMNICEF Take 1 capsule (300 mg total) by mouth 2 (two) times daily for 5 days.   guaiFENesin 600 MG 12 hr tablet Commonly known as: MUCINEX Take 1 tablet (600 mg total) by mouth 2 (two) times daily for 5 days.   ibuprofen 200 MG tablet Commonly known as: ADVIL Take 200-400 mg by mouth every 6 (six) hours as needed for moderate pain or headache.   lidocaine 5 % Commonly known as: LIDODERM Place 1 patch onto the skin daily. Remove & Discard patch within 12 hours or as directed by MD        No Known Allergies  Consultations: None  Procedures/Studies: DG Chest 2 View  Result Date: 08/23/2021 CLINICAL DATA:  Cough, fever EXAM: CHEST - 2 VIEW COMPARISON:  10/28/2017 FINDINGS: Bilateral lower lobe airspace opacities concerning for pneumonia. Heart is normal size. No effusions or acute bony abnormality. IMPRESSION: Bilateral lower lobe opacities compatible with pneumonia. Electronically Signed   By: Charlett Nose M.D.   On: 08/23/2021 00:41   CT Angio Chest PE W and/or Wo Contrast  Result Date: 08/23/2021 CLINICAL DATA:  Chest pain shortness of. EXAM: CT ANGIOGRAPHY CHEST WITH CONTRAST TECHNIQUE: Multidetector CT imaging of the chest was performed using the standard  protocol during bolus administration of intravenous contrast. Multiplanar CT image reconstructions and MIPs were obtained to evaluate the vascular anatomy. CONTRAST:  70mL OMNIPAQUE IOHEXOL 350 MG/ML SOLN COMPARISON:  None. FINDINGS: Cardiovascular: The heart is normal in size. No pericardial effusion. The aorta is normal in caliber. No dissection. The branch vessels are patent. The pulmonary arterial tree is well opacified. No filling defects to suggest. Mediastinum/Nodes: Borderline enlarged mediastinal lymph nodes likely reactive lymphadenopathy. The esophagus is grossly. Lungs/Pleura: Patchy nodular airspace process along with more focal airspace consolidation in the right lower lobe. Small associated right pleural effusion. No worrisome pulmonary lesions. No pneumothorax. Upper Abdomen: No significant findings. Musculoskeletal:  No significant bony findings. Review of the MIP images confirms the above findings. IMPRESSION: 1. No CT findings for pulmonary embolism. 2. Normal thoracic aorta. 3. Multifocal pneumonia. 4. Small associated right pleural effusion. 5. Borderline enlarged mediastinal lymph nodes, likely reactive lymphadenopathy. Electronically Signed   By: Rudie Meyer M.D.   On: 08/23/2021 07:12   DG CHEST PORT 1 VIEW  Result Date: 08/25/2021 CLINICAL DATA:  Shortness of breath.  Cough.  History of smoking. EXAM: PORTABLE CHEST 1 VIEW COMPARISON:  08/23/2021; chest CT-08/23/2021 FINDINGS: Grossly unchanged cardiac silhouette and mediastinal contours. Minimally improved aeration of the right base. A otherwise, unchanged heterogeneous airspace opacities in a peripheral aspect the right upper lung as well as lower lung. No new focal airspace opacities. No pleural effusion or pneumothorax. No evidence of edema. No acute osseous abnormalities. IMPRESSION: Minimally improved aeration of the right lower lung. Otherwise, similar appearing right mid and left lower lung heterogeneous airspace opacities  worrisome for multifocal infection. A follow-up chest radiograph in 3 to 4 weeks after treatment is recommended to ensure complete resolution. Electronically Signed   By: Simonne Come M.D.   On: 08/25/2021 08:19   CT Renal Stone Study  Result Date: 08/23/2021 CLINICAL DATA:  Flank pain.  Kidney stone suspected. EXAM: CT ABDOMEN AND PELVIS WITHOUT CONTRAST TECHNIQUE: Multidetector CT imaging of the abdomen and pelvis was performed following the standard protocol without IV contrast. COMPARISON:  None. FINDINGS: Lower chest: Multifocal pulmonary opacities in the included lung bases. This appears ground-glass and multinodular, with more confluent consolidation in the dependent right lower lobe and lingula. Heart is normal in size. Hepatobiliary: No focal hepatic abnormality on this unenhanced exam. Decompressed gallbladder. No calcified gallstone. There is no biliary dilatation. Pancreas: No ductal dilatation or inflammation. Spleen: Normal in size without focal abnormality. Adrenals/Urinary Tract: No adrenal nodule. No hydronephrosis or renal calculi. No perinephric edema. No focal renal lesion on this unenhanced exam. Unremarkable urinary bladder. Stomach/Bowel: Detailed bowel assessment is limited in the absence of enteric contrast and paucity of intra-abdominal fat. Decompressed stomach. There is no small bowel obstruction or inflammation. The appendix is not confidently visualized on the current exam. There is no evidence of appendicitis. Small volume colonic stool. Mild distal colonic redundancy. Vascular/Lymphatic: Normal caliber abdominal aorta. No portal venous or mesenteric gas. No abdominopelvic adenopathy. Reproductive: Prostate is unremarkable. Other: No free air.  No ascites.  No abdominal wall hernia. Musculoskeletal: There are no acute or suspicious osseous abnormalities. IMPRESSION: 1. No renal stones or obstructive uropathy. 2. Multifocal pneumonia in the included lung bases. There is diffuse  nodular ground-glass opacity with more confluent consolidation in the dependent right lower lobe and lingula. Electronically Signed   By: Narda Rutherford M.D.   On: 08/23/2021 00:37    Subjective: Seen and examined at bedside and he is feeling much better.  Resting comfortably.  Cough is improving and back pain is improved also.  No chest pain or shortness of breath.  No other concerns or plans this time and is stable for discharge at this time and follow-up with PCP within 1 to 2 weeks  Discharge Exam: Vitals:   08/24/21 2022 08/25/21 0503  BP: 120/76 118/70  Pulse: (!) 55 60  Resp:    Temp: 97.6 F (36.4 C) 97.7 F (36.5 C)  SpO2: 98% 97%   Vitals:   08/24/21 0455 08/24/21 1303 08/24/21 2022 08/25/21 0503  BP: 115/68 127/82 120/76 118/70  Pulse: (!) 53 68 (!) 55 60  Resp: 20 16    Temp:  98.2 F (36.8 C) 97.6 F (36.4 C) 97.7 F (36.5 C)  TempSrc:  Oral Oral Oral  SpO2: 93% 98% 98% 97%  Weight:      Height:       General: Pt is alert, awake, not in acute distress Cardiovascular: RRR, S1/S2 +, no rubs, no gallops Respiratory: Diminished bilaterally, no wheezing, no rhonchi; unlabored breathing Abdominal: Soft, NT, ND, bowel sounds + Extremities: no edema, no cyanosis  The results of significant diagnostics from this hospitalization (including imaging, microbiology, ancillary and laboratory) are listed below for reference.    Microbiology: Recent Results (from the past 240 hour(s))  Resp Panel by RT-PCR (Flu A&B, Covid) Nasopharyngeal Swab     Status: None   Collection Time: 08/19/21  2:19 AM   Specimen: Nasopharyngeal Swab; Nasopharyngeal(NP) swabs in vial transport medium  Result Value Ref Range Status   SARS Coronavirus 2 by RT PCR NEGATIVE NEGATIVE Final    Comment: (NOTE) SARS-CoV-2 target nucleic acids are NOT DETECTED.  The SARS-CoV-2 RNA is generally detectable in upper respiratory specimens during the acute phase of infection. The lowest concentration of  SARS-CoV-2 viral copies this assay can detect is 138 copies/mL. A negative result does not preclude SARS-Cov-2 infection and should not be used as the sole basis for treatment or other patient management decisions. A negative result may occur with  improper specimen collection/handling, submission of specimen other than nasopharyngeal swab, presence of viral mutation(s) within the areas targeted by this assay, and inadequate number of viral copies(<138 copies/mL). A negative result must be combined with clinical observations, patient history, and epidemiological information. The expected result is Negative.  Fact Sheet for Patients:  BloggerCourse.com  Fact Sheet for Healthcare Providers:  SeriousBroker.it  This test is no t yet approved or cleared by the [place] FDA and  has been authorized for detection and/or diagnosis of SARS-CoV-2 by FDA under an Emergency Use Authorization (EUA). This EUA will remain  in effect (meaning this test can be used) for the duration of the COVID-19 declaration under Section 564(b)(1) of the Act, 21 U.S.C.section 360bbb-3(b)(1), unless the authorization is terminated  or revoked sooner.       Influenza A by PCR NEGATIVE NEGATIVE Final   Influenza B by PCR NEGATIVE NEGATIVE Final    Comment: (NOTE) The Xpert Xpress SARS-CoV-2/FLU/RSV plus assay is intended as an aid in the diagnosis of influenza from Nasopharyngeal swab specimens and should not be used as a sole basis for treatment. Nasal washings and aspirates are unacceptable for Xpert Xpress SARS-CoV-2/FLU/RSV testing.  Fact Sheet for Patients: BloggerCourse.com  Fact Sheet for Healthcare Providers: SeriousBroker.it  This test is not yet approved or cleared by the [place] FDA and has been authorized for detection and/or diagnosis of SARS-CoV-2 by FDA under an Emergency Use  Authorization (EUA). This EUA will remain in effect (meaning this test can be used) for the duration of the COVID-19 declaration under Section 564(b)(1) of the Act, 21 U.S.C. section 360bbb-3(b)(1), unless the authorization is terminated or revoked.  Performed at [facility], [address] [place], [place] [address]   Resp Panel by RT-PCR (Flu A&B, Covid) Nasopharyngeal Swab     Status: None   Collection Time: 08/22/21 11:58 PM   Specimen: Nasopharyngeal Swab; Nasopharyngeal(NP) swabs in vial transport medium  Result Value Ref Range Status   SARS Coronavirus 2 by RT PCR NEGATIVE NEGATIVE Final    Comment: (NOTE) SARS-CoV-2 target  nucleic acids are NOT DETECTED.  The SARS-CoV-2 RNA is generally detectable in upper respiratory specimens during the acute phase of infection. The lowest concentration of SARS-CoV-2 viral copies this assay can detect is 138 copies/mL. A negative result does not preclude SARS-Cov-2 infection and should not be used as the sole basis for treatment or other patient management decisions. A negative result may occur with  improper specimen collection/handling, submission of specimen other than nasopharyngeal swab, presence of viral mutation(s) within the areas targeted by this assay, and inadequate number of viral copies(<138 copies/mL). A negative result must be combined with clinical observations, patient history, and epidemiological information. The expected result is Negative.  Fact Sheet for Patients:  BloggerCourse.com  Fact Sheet for Healthcare Providers:  SeriousBroker.it  This test is no t yet approved or cleared by the [place] FDA and  has been authorized for detection and/or diagnosis of SARS-CoV-2 by FDA under an Emergency Use Authorization (EUA). This EUA will remain  in effect (meaning this test can be used) for the duration of the COVID-19 declaration under Section 564(b)(1) of  the Act, 21 U.S.C.section 360bbb-3(b)(1), unless the authorization is terminated  or revoked sooner.       Influenza A by PCR NEGATIVE NEGATIVE Final   Influenza B by PCR NEGATIVE NEGATIVE Final    Comment: (NOTE) The Xpert Xpress SARS-CoV-2/FLU/RSV plus assay is intended as an aid in the diagnosis of influenza from Nasopharyngeal swab specimens and should not be used as a sole basis for treatment. Nasal washings and aspirates are unacceptable for Xpert Xpress SARS-CoV-2/FLU/RSV testing.  Fact Sheet for Patients: BloggerCourse.com  Fact Sheet for Healthcare Providers: SeriousBroker.it  This test is not yet approved or cleared by the [place] FDA and has been authorized for detection and/or diagnosis of SARS-CoV-2 by FDA under an Emergency Use Authorization (EUA). This EUA will remain in effect (meaning this test can be used) for the duration of the COVID-19 declaration under Section 564(b)(1) of the Act, 21 U.S.C. section 360bbb-3(b)(1), unless the authorization is terminated or revoked.  Performed at [facility], [address], [place], [place] [address]   Culture, blood (routine x 2)     Status: None (Preliminary result)   Collection Time: 08/23/21 12:06 AM   Specimen: BLOOD  Result Value Ref Range Status   Specimen Description BLOOD RIGHT ANTECUBITAL  Final   Special Requests   Final    BOTTLES DRAWN AEROBIC AND ANAEROBIC Blood Culture results may not be optimal due to an inadequate volume of blood received in culture bottles   Culture   Final    NO GROWTH 2 DAYS Performed at [facility], [address] [place], [place] [address]    Report Status PENDING  Incomplete  Culture, blood (routine x 2)     Status: None (Preliminary result)   Collection Time: 08/23/21  5:26 AM   Specimen: BLOOD RIGHT HAND  Result Value Ref Range Status   Specimen Description BLOOD RIGHT HAND  Final   Special Requests    Final    BOTTLES DRAWN AEROBIC AND ANAEROBIC Blood Culture results may not be optimal due to an inadequate volume of blood received in culture bottles   Culture   Final    NO GROWTH 2 DAYS Performed at [facility], [address], [place], [place] [address]    Report Status PENDING  Incomplete   Labs: BNP (last 3 results) No results for input(s): BNP in the last  8760 hours. Basic Metabolic Panel: Recent Labs  Lab 08/19/21 0248 08/23/21 0002 08/24/21 0406 08/25/21 0013  NA 137 135 138 139  K 4.0 3.5 3.8 3.8  CL 106 102 105 107  CO2 GLUCOSE 119* 115* 99 96  BUN CREATININE 1.32* 0.96 0.81 0.88  CALCIUM 8.8* 8.8* 8.8* 8.4*  MG  --   --   --  2.1  PHOS  --   --   --  3.6   Liver Function Tests: Recent Labs  Lab 08/19/21 0248 08/23/21 0002 08/25/21 0013  AST 22 24 13*  ALT ALKPHOS 68 63 49  BILITOT 0.5 0.6 0.4  PROT 7.1 7.1 5.8*  ALBUMIN 3.7 3.3* 2.5*   No results for input(s): LIPASE, AMYLASE in the last 168 hours. No results for input(s): AMMONIA in the last 168 hours. CBC: Recent Labs  Lab 08/19/21 0248 08/23/21 0002 08/24/21 0406 08/25/21 0013  WBC 5.4 9.5 6.5 6.9  NEUTROABS 4.1 7.4 3.6 3.3  HGB 15.9 15.4 14.0 13.9  HCT 47.4 44.6 41.2 41.1  MCV 92.9 89.4 89.4 89.9  PLT 144* 99* 121* 155   Cardiac Enzymes: No results for input(s): CKTOTAL, CKMB, CKMBINDEX, TROPONINI in the last 168 hours. BNP: Invalid input(s): POCBNP CBG: No results for input(s): GLUCAP in the last 168 hours. D-Dimer No results for input(s): DDIMER in the last 72 hours. Hgb A1c No results for input(s): HGBA1C in the last 72 hours. Lipid Profile No results for input(s): CHOL, HDL, LDLCALC, TRIG, CHOLHDL, LDLDIRECT in the last 72 hours. Thyroid function studies No results for input(s): TSH, T4TOTAL, T3FREE, THYROIDAB in the last 72 hours.  Invalid input(s): FREET3 Anemia work up No results for input(s): VITAMINB12, FOLATE, FERRITIN, TIBC,  IRON, RETICCTPCT in the last 72 hours. Urinalysis    Component Value Date/Time   COLORURINE YELLOW 08/23/2021 0549   APPEARANCEUR CLEAR 08/23/2021 0549   LABSPEC 1.021 08/23/2021 0549   PHURINE 5.0 08/23/2021 0549   GLUCOSEU NEGATIVE 08/23/2021 0549   HGBUR NEGATIVE 08/23/2021 0549   BILIRUBINUR NEGATIVE 08/23/2021 0549   KETONESUR 5 (A) 08/23/2021 0549   PROTEINUR 30 (A) 08/23/2021 0549   NITRITE NEGATIVE 08/23/2021 0549   LEUKOCYTESUR NEGATIVE 08/23/2021 0549   Sepsis Labs Invalid input(s): PROCALCITONIN,  WBC,  LACTICIDVEN Microbiology Recent Results (from the past 240 hour(s))  Resp Panel by RT-PCR (Flu A&B, Covid) Nasopharyngeal Swab     Status: None   Collection Time: 08/19/21  2:19 AM   Specimen: Nasopharyngeal Swab; Nasopharyngeal(NP) swabs in vial transport medium  Result Value Ref Range Status   SARS Coronavirus 2 by RT PCR NEGATIVE NEGATIVE Final    Comment: (NOTE) SARS-CoV-2 target nucleic acids are NOT DETECTED.  The SARS-CoV-2 RNA is generally detectable in upper respiratory specimens during the acute phase of infection. The lowest concentration of SARS-CoV-2 viral copies this assay can detect is 138 copies/mL. A negative result does not preclude SARS-Cov-2 infection and should not be used as the sole basis for treatment or other patient management decisions. A negative result may occur with  improper specimen collection/handling, submission of specimen other than nasopharyngeal swab, presence of viral mutation(s) within the areas targeted by this assay, and inadequate number of viral copies(<138 copies/mL). A negative result must be combined with clinical observations, patient history, and epidemiological information. The expected result is Negative.  Fact Sheet for Patients:  BloggerCourse.com  Fact Sheet for Healthcare Providers:  SeriousBroker.it  This test is no t yet approved or cleared by the [place] and  has been authorized for detection and/or diagnosis of SARS-CoV-2 by FDA under an Emergency Use Authorization (EUA). This EUA will remain  in effect (meaning this test can be used) for the duration of the COVID-19 declaration under Section 564(b)(1) of the Act, 21 U.S.C.section 360bbb-3(b)(1), unless the authorization is terminated  or revoked sooner.       Influenza A by PCR NEGATIVE NEGATIVE Final   Influenza B by PCR NEGATIVE NEGATIVE Final    Comment: (NOTE) The Xpert Xpress SARS-CoV-2/FLU/RSV plus assay is intended as an aid in the diagnosis of influenza from Nasopharyngeal swab specimens and should not be used as a sole basis for treatment. Nasal washings and aspirates are unacceptable for Xpert Xpress SARS-CoV-2/FLU/RSV testing.  Fact Sheet for Patients: BloggerCourse.com  Fact Sheet for Healthcare Providers: SeriousBroker.it  This test is not yet approved or cleared by the [place] FDA and has been authorized for detection and/or diagnosis of SARS-CoV-2 by FDA under an Emergency Use Authorization (EUA). This EUA will remain in effect (meaning this test can be used) for the duration of the COVID-19 declaration under Section 564(b)(1) of the Act, 21 U.S.C. section 360bbb-3(b)(1), unless the authorization is terminated or revoked.  Performed at [facility], [address], [place], [place] [address]   Resp Panel by RT-PCR (Flu A&B, Covid) Nasopharyngeal Swab     Status: None   Collection Time: 08/22/21 11:58 PM   Specimen: Nasopharyngeal Swab; Nasopharyngeal(NP) swabs in vial transport medium  Result Value Ref Range Status   SARS Coronavirus 2 by RT PCR NEGATIVE NEGATIVE Final    Comment: (NOTE) SARS-CoV-2 target nucleic acids are NOT DETECTED.  The SARS-CoV-2 RNA is generally detectable in upper respiratory specimens during the acute phase of infection. The lowest concentration of  SARS-CoV-2 viral copies this assay can detect is 138 copies/mL. A negative result does not preclude SARS-Cov-2 infection and should not be used as the sole basis for treatment or other patient management decisions. A negative result may occur with  improper specimen collection/handling, submission of specimen other than nasopharyngeal swab, presence of viral mutation(s) within the areas targeted by this assay, and inadequate number of viral copies(<138 copies/mL). A negative result must be combined with clinical observations, patient history, and epidemiological information. The expected result is Negative.  Fact Sheet for Patients:  BloggerCourse.com  Fact Sheet for Healthcare Providers:  SeriousBroker.it  This test is no t yet approved or cleared by the [place] FDA and  has been authorized for detection and/or diagnosis of SARS-CoV-2 by FDA under an Emergency Use Authorization (EUA). This EUA will remain  in effect (meaning this test can be used) for the duration of the COVID-19 declaration under Section 564(b)(1) of the Act, 21 U.S.C.section 360bbb-3(b)(1), unless the authorization is terminated  or revoked sooner.       Influenza A by PCR NEGATIVE NEGATIVE Final   Influenza B by PCR NEGATIVE NEGATIVE Final    Comment: (NOTE) The Xpert Xpress SARS-CoV-2/FLU/RSV plus assay is intended as an aid in the diagnosis of influenza from Nasopharyngeal swab specimens and should not be used as a sole basis for treatment. Nasal washings and aspirates are unacceptable for Xpert Xpress SARS-CoV-2/FLU/RSV testing.  Fact Sheet for Patients: BloggerCourse.com  Fact Sheet for Healthcare Providers: SeriousBroker.it  This test is not yet approved or cleared by the [place] FDA and has been authorized for detection and/or  diagnosis of SARS-CoV-2 by FDA under an Emergency Use  Authorization (EUA). This EUA will remain in effect (meaning this test can be used) for the duration of the COVID-19 declaration under Section 564(b)(1) of the Act, 21 U.S.C. section 360bbb-3(b)(1), unless the authorization is terminated or revoked.  Performed at [facility], [address] [place], [place] [address]   Culture, blood (routine x 2)     Status: None (Preliminary result)   Collection Time: 08/23/21 12:06 AM   Specimen: BLOOD  Result Value Ref Range Status   Specimen Description BLOOD RIGHT ANTECUBITAL  Final   Special Requests   Final    BOTTLES DRAWN AEROBIC AND ANAEROBIC Blood Culture results may not be optimal due to an inadequate volume of blood received in culture bottles   Culture   Final    NO GROWTH 2 DAYS Performed at [facility], [address] [place], [place] [address]    Report Status PENDING  Incomplete  Culture, blood (routine x 2)     Status: None (Preliminary result)   Collection Time: 08/23/21  5:26 AM   Specimen: BLOOD RIGHT HAND  Result Value Ref Range Status   Specimen Description BLOOD RIGHT HAND  Final   Special Requests   Final    BOTTLES DRAWN AEROBIC AND ANAEROBIC Blood Culture results may not be optimal due to an inadequate volume of blood received in culture bottles   Culture   Final    NO GROWTH 2 DAYS Performed at [facility], [address] [place], [place] [address]    Report Status PENDING  Incomplete   Time coordinating discharge: 35 minutes  SIGNED:  Merlene Laughtermair Latif Rhemi Balbach, DO [facility] 08/25/2021, 5:42 PM Pager is on AMION  If 7PM-7AM, please contact night-coverage [email or web address]

## 2021-08-28 LAB — CULTURE, BLOOD (ROUTINE X 2)
Culture: NO GROWTH
Culture: NO GROWTH

## 2021-10-06 ENCOUNTER — Encounter (HOSPITAL_COMMUNITY): Payer: Self-pay | Admitting: Radiology

## 2021-11-09 ENCOUNTER — Emergency Department (HOSPITAL_COMMUNITY)
Admission: EM | Admit: 2021-11-09 | Discharge: 2021-11-09 | Disposition: A | Discharge status: Home / Self Care | Payer: Medicaid Other | Attending: Emergency Medicine | Admitting: Emergency Medicine

## 2021-11-09 ENCOUNTER — Emergency Department (HOSPITAL_COMMUNITY): Payer: Medicaid Other

## 2021-11-09 ENCOUNTER — Other Ambulatory Visit: Payer: Self-pay

## 2021-11-09 DIAGNOSIS — M545 Low back pain, unspecified: Secondary | ICD-10-CM | POA: Insufficient documentation

## 2021-11-09 DIAGNOSIS — Z87891 Personal history of nicotine dependence: Secondary | ICD-10-CM | POA: Insufficient documentation

## 2021-11-09 DIAGNOSIS — Z20822 Contact with and (suspected) exposure to covid-19: Secondary | ICD-10-CM | POA: Diagnosis not present

## 2021-11-09 DIAGNOSIS — R509 Fever, unspecified: Secondary | ICD-10-CM | POA: Diagnosis present

## 2021-11-09 DIAGNOSIS — J101 Influenza due to other identified influenza virus with other respiratory manifestations: Secondary | ICD-10-CM | POA: Diagnosis not present

## 2021-11-09 LAB — CBC WITH DIFFERENTIAL/PLATELET
Abs Immature Granulocytes: 0.02 10*3/uL (ref 0.00–0.07)
Basophils Absolute: 0 10*3/uL (ref 0.0–0.1)
Basophils Relative: 0 %
Eosinophils Absolute: 0 10*3/uL (ref 0.0–0.5)
Eosinophils Relative: 0 %
HCT: 44.3 % (ref 39.0–52.0)
Hemoglobin: 14.6 g/dL (ref 13.0–17.0)
Immature Granulocytes: 0 %
Lymphocytes Relative: 6 %
Lymphs Abs: 0.5 10*3/uL — ABNORMAL LOW (ref 0.7–4.0)
MCH: 31.1 pg (ref 26.0–34.0)
MCHC: 33 g/dL (ref 30.0–36.0)
MCV: 94.5 fL (ref 80.0–100.0)
Monocytes Absolute: 1 10*3/uL (ref 0.1–1.0)
Monocytes Relative: 12 %
Neutro Abs: 6.8 10*3/uL (ref 1.7–7.7)
Neutrophils Relative %: 82 %
Platelets: 160 10*3/uL (ref 150–400)
RBC: 4.69 MIL/uL (ref 4.22–5.81)
RDW: 12.9 % (ref 11.5–15.5)
WBC: 8.3 10*3/uL (ref 4.0–10.5)
nRBC: 0 % (ref 0.0–0.2)

## 2021-11-09 LAB — URINALYSIS, ROUTINE W REFLEX MICROSCOPIC
Bilirubin Urine: NEGATIVE
Glucose, UA: NEGATIVE mg/dL
Hgb urine dipstick: NEGATIVE
Ketones, ur: NEGATIVE mg/dL
Leukocytes,Ua: NEGATIVE
Nitrite: NEGATIVE
Protein, ur: NEGATIVE mg/dL
Specific Gravity, Urine: 1.021 (ref 1.005–1.030)
pH: 6 (ref 5.0–8.0)

## 2021-11-09 LAB — RESP PANEL BY RT-PCR (FLU A&B, COVID) ARPGX2
Influenza A by PCR: POSITIVE — AB
Influenza B by PCR: NEGATIVE
SARS Coronavirus 2 by RT PCR: NEGATIVE

## 2021-11-09 LAB — COMPREHENSIVE METABOLIC PANEL
ALT: 24 U/L (ref 0–44)
AST: 29 U/L (ref 15–41)
Albumin: 4.3 g/dL (ref 3.5–5.0)
Alkaline Phosphatase: 71 U/L (ref 38–126)
Anion gap: 6 (ref 5–15)
BUN: 9 mg/dL (ref 6–20)
CO2: 26 mmol/L (ref 22–32)
Calcium: 9.1 mg/dL (ref 8.9–10.3)
Chloride: 103 mmol/L (ref 98–111)
Creatinine, Ser: 1.07 mg/dL (ref 0.61–1.24)
GFR, Estimated: >60 mL/min (ref >60)
Glucose, Bld: 93 mg/dL (ref 70–99)
Potassium: 3.7 mmol/L (ref 3.5–5.1)
Sodium: 135 mmol/L (ref 135–145)
Total Bilirubin: 0.8 mg/dL (ref 0.3–1.2)
Total Protein: 7.7 g/dL (ref 6.5–8.1)

## 2021-11-09 LAB — LACTIC ACID, PLASMA: Lactic Acid, Venous: 0.8 mmol/L (ref 0.5–1.9)

## 2021-11-09 MED ORDER — LACTATED RINGERS IV BOLUS
1000.0000 mL | Freq: Once | INTRAVENOUS | Status: AC
  Administered 2021-11-09: 1000 mL via INTRAVENOUS

## 2021-11-09 MED ORDER — ACETAMINOPHEN 325 MG PO TABS: 650.0000 mg | ORAL_TABLET | Freq: Once | ORAL | Status: DC | PRN

## 2021-11-09 MED ORDER — ACETAMINOPHEN 500 MG PO TABS
1000.0000 mg | ORAL_TABLET | Freq: Once | ORAL | Status: AC | PRN
  Administered 2021-11-09: 1000 mg via ORAL
  Filled 2021-11-09: qty 2

## 2021-11-09 NOTE — ED Notes (Signed)
Patient transported to X-ray 

## 2021-11-09 NOTE — Discharge Instructions (Signed)
Please continue to take Tylenol as well as ibuprofen for management of your fevers and body aches.  I would recommend rotating these 2 medications.  Please follow the instructions on the bottles.  Please make sure you are staying adequately hydrated and drinking at least 64 ounces of water per day.  If you develop any new or worsening symptoms please come back to the emergency department.

## 2021-11-09 NOTE — ED Provider Notes (Signed)
[facility]-EMERGENCY DEPT Provider Note   CSN: [id number] Arrival date & time: 11/09/21  1635     History Chief Complaint  Patient presents with   Back Pain   Fever    Todd Mclaughlin is a 25 y.o. male.  HPI Patient is a 25 year old male with a medical history as noted below.  He presents to the emergency department due to headache, fevers, as well as low back pain.  States that last night he began experiencing a mild diffuse headache and went to bed.  He then began developing diffuse low back pain as well as subjective fevers.  States the pain is diffuse and nonradiating.  No chest pain, shortness of breath, cough, rhinorrhea, sore throat, abdominal pain, nausea, vomiting, diarrhea, dysuria, urinary frequency.  Denies any history of IVDA.  Denies night sweats or weight loss.  Patient's significant other is also in the emergency department with similar symptoms as well as cough and congestion.  Patient states he is vaccinated for COVID-19 x2 and denies any known history of previous COVID-19 infections.  States he has not been vaccinated for the flu this year.    Past Medical History:  Diagnosis Date   Tobacco abuse     Patient Active Problem List   Diagnosis Date Noted   Sepsis due to pneumonia (HCC) 08/23/2021   Back pain 08/23/2021   Tobacco use 08/23/2021   Thrombocytopenia (HCC) 08/23/2021    No past surgical history on file.     Family History  Problem Relation Age of Onset   Diabetes Cousin     Social History   Tobacco Use   Smoking status: Former    Packs/day: 1.00    Types: Cigarettes   Smokeless tobacco: Never   Tobacco comments:    Smoking 07/2021  Vaping Use   Vaping Use: Never used  Substance Use Topics   Alcohol use: No   Drug use: Yes    Types: Marijuana    Home Medications Prior to Admission medications   Medication Sig Start Date End Date Taking? Authorizing Provider  albuterol (VENTOLIN HFA) 108 (90 Base) MCG/ACT  inhaler Inhale 2 puffs into the lungs every 6 (six) hours as needed for wheezing or shortness of breath. 08/25/21   Sheikh, Omair Latif, DO  benzonatate (TESSALON PERLES) 100 MG capsule Take 1 capsule (100 mg total) by mouth 3 (three) times daily as needed for cough. 08/25/21 08/25/22  Marguerita Merles Latif, DO  ibuprofen (ADVIL) 200 MG tablet Take 200-400 mg by mouth every 6 (six) hours as needed for moderate pain or headache.    [provider]  lidocaine (LIDODERM) 5 % Place 1 patch onto the skin daily. Remove & Discard patch within 12 hours or as directed by MD 08/25/21   Merlene Laughter, DO    Allergies    Patient has no known allergies.  Review of Systems   Review of Systems  All other systems reviewed and are negative. Ten systems reviewed and are negative for acute change, except as noted in the HPI.   Physical Exam Updated Vital Signs BP 111/82 (BP Location: Right Arm)   Pulse (!) 106   Temp (!) 100.6 F (38.1 C) (Oral)   Resp 16   Ht 5\' 7"  (1.702 m)   Wt 68 kg   SpO2 99%   BMI 23.49 kg/m   Physical Exam Vitals and nursing note reviewed.  Constitutional:      General: He is not in acute distress.  Appearance: Normal appearance. He is not ill-appearing, toxic-appearing or diaphoretic.  HENT:     Head: Normocephalic and atraumatic.     Right Ear: External ear normal.     Left Ear: External ear normal.     Nose: Nose normal.     Mouth/Throat:     Mouth: Mucous membranes are moist.     Pharynx: Oropharynx is clear. No oropharyngeal exudate or posterior oropharyngeal erythema.  Eyes:     Extraocular Movements: Extraocular movements intact.  Cardiovascular:     Rate and Rhythm: Normal rate and regular rhythm.     Pulses: Normal pulses.     Heart sounds: Normal heart sounds. No murmur heard.   No friction rub. No gallop.  Pulmonary:     Effort: Pulmonary effort is normal. No respiratory distress.     Breath sounds: Normal breath sounds. No stridor. No wheezing,  rhonchi or rales.  Abdominal:     General: Abdomen is flat.     Palpations: Abdomen is soft.     Tenderness: There is no abdominal tenderness.  Musculoskeletal:        General: Tenderness present. Normal range of motion.     Cervical back: Normal range of motion and neck supple. No tenderness.     Comments: Mild tenderness noted diffusely in the lumbar region.  Skin:    General: Skin is warm and dry.  Neurological:     General: No focal deficit present.     Mental Status: He is alert and oriented to person, place, and time.     Comments: Strength is 5/5 in all 4 extremities.  Ambulating without difficulty.  Distal sensation intact.  2+ pedal pulses.  Psychiatric:        Mood and Affect: Mood normal.        Behavior: Behavior normal.   ED Results / Procedures / Treatments   Labs (all labs ordered are listed, but only abnormal results are displayed) Labs Reviewed  RESP PANEL BY RT-PCR (FLU A&B, COVID) ARPGX2 - Abnormal; Notable for the following components:      Result Value   Influenza A by PCR POSITIVE (*)    All other components within normal limits  CBC WITH DIFFERENTIAL/PLATELET - Abnormal; Notable for the following components:   Lymphs Abs 0.5 (*)    All other components within normal limits  COMPREHENSIVE METABOLIC PANEL  LACTIC ACID, PLASMA  URINALYSIS, ROUTINE W REFLEX MICROSCOPIC    EKG None  Radiology DG Chest 2 View  Result Date: 11/09/2021 CLINICAL DATA:  Bilateral lower back pain. EXAM: CHEST - 2 VIEW COMPARISON:  August 25, 2021 FINDINGS: The heart size and mediastinal contours are within normal limits. Both lungs are clear. The visualized skeletal structures are unremarkable. IMPRESSION: No active cardiopulmonary disease. Electronically Signed   By: Gerome Sam III M.D.   On: 11/09/2021 17:19    Procedures Procedures   Medications Ordered in ED Medications  lactated ringers bolus 1,000 mL (0 mLs Intravenous Stopped 11/09/21 1840)  acetaminophen  (TYLENOL) tablet 1,000 mg (1,000 mg Oral Given 11/09/21 1706)    ED Course  I have reviewed the triage vital signs and the nursing notes.  Pertinent labs & imaging results that were available during my care of the patient were reviewed by me and considered in my medical decision making (see chart for details).  Clinical Course as of 11/09/21 1847  Thu Nov 09, 2021  1801 Influenza A By PCR(!): POSITIVE [LJ]    Clinical Course  User Index [LJ] Placido Sou, PA-C   MDM Rules/Calculators/A&P                          Pt is a 25 y.o. male who presents to the emergency department with symptoms associated with influenza A.  Labs: CBC with lymphocytes of 0.5. CMP without abnormalities. Lactic acid 0.8. UA is negative. Respiratory panel is positive for influenza A.  Imaging: Chest x-ray shows no active cardiopulmonary disease.  I, Placido Sou, PA-C, personally reviewed and evaluated these images and lab results as part of my medical decision-making.  Patient is positive for influenza A.  Lab work is otherwise reassuring.  Patient given Tylenol as well as IV fluids for his symptoms.  Fever has improved to 100.6 F.  Patient states that his headache and back pain have completely resolved and that he has "feeling much better".  Physical exam significant for mild tenderness diffusely in the lumbar region.  No focal tenderness.  No CVA tenderness.  Patient denies any urinary complaints and UA is negative.  Patient neurovascularly intact in the bilateral lower extremities.  Denies a history of IVDA.  No weight loss or night sweats.  Doubt SEA at this time.  Feels the patient is stable for discharge at this time and he is agreeable.  Recommended continued use of Tylenol and ibuprofen.  Discussed adequate hydration.  Discussed return precautions.  His questions were answered and he was amicable at the time of discharge.  Note: Portions of this report may have been transcribed using voice  recognition software. Every effort was made to ensure accuracy; however, inadvertent computerized transcription errors may be present.   Final Clinical Impression(s) / ED Diagnoses Final diagnoses:  Influenza A   Rx / DC Orders ED Discharge Orders     None        Placido Sou, PA-C 11/09/21 1847    Vanetta Mulders, MD 11/15/21 [phone]

## 2021-11-09 NOTE — ED Notes (Signed)
PT provided with urinal 

## 2021-11-09 NOTE — ED Triage Notes (Signed)
Patient c/o bilateral lower back pain since 0200 today. Patient denies pain radiating down legs.  Temp in triage-103.0

## 2021-11-12 ENCOUNTER — Emergency Department (HOSPITAL_COMMUNITY): Payer: Medicaid Other

## 2021-11-12 ENCOUNTER — Other Ambulatory Visit: Payer: Self-pay

## 2021-11-12 ENCOUNTER — Emergency Department (HOSPITAL_COMMUNITY)
Admission: EM | Admit: 2021-11-12 | Discharge: 2021-11-12 | Disposition: A | Discharge status: Home / Self Care | Payer: Medicaid Other | Attending: Emergency Medicine | Admitting: Emergency Medicine

## 2021-11-12 ENCOUNTER — Encounter (HOSPITAL_COMMUNITY): Payer: Self-pay | Admitting: Emergency Medicine

## 2021-11-12 DIAGNOSIS — M546 Pain in thoracic spine: Secondary | ICD-10-CM | POA: Insufficient documentation

## 2021-11-12 DIAGNOSIS — R109 Unspecified abdominal pain: Secondary | ICD-10-CM | POA: Diagnosis not present

## 2021-11-12 DIAGNOSIS — Z87891 Personal history of nicotine dependence: Secondary | ICD-10-CM | POA: Diagnosis not present

## 2021-11-12 DIAGNOSIS — M545 Low back pain, unspecified: Secondary | ICD-10-CM | POA: Insufficient documentation

## 2021-11-12 LAB — CBC
HCT: 44.5 % (ref 39.0–52.0)
Hemoglobin: 15.3 g/dL (ref 13.0–17.0)
MCH: 31.2 pg (ref 26.0–34.0)
MCHC: 34.4 g/dL (ref 30.0–36.0)
MCV: 90.6 fL (ref 80.0–100.0)
Platelets: 175 10*3/uL (ref 150–400)
RBC: 4.91 MIL/uL (ref 4.22–5.81)
RDW: 12.5 % (ref 11.5–15.5)
WBC: 4.7 10*3/uL (ref 4.0–10.5)
nRBC: 0 % (ref 0.0–0.2)

## 2021-11-12 LAB — BASIC METABOLIC PANEL
Anion gap: 7 (ref 5–15)
BUN: 16 mg/dL (ref 6–20)
CO2: 25 mmol/L (ref 22–32)
Calcium: 8.9 mg/dL (ref 8.9–10.3)
Chloride: 105 mmol/L (ref 98–111)
Creatinine, Ser: 0.84 mg/dL (ref 0.61–1.24)
GFR, Estimated: >60 mL/min (ref >60)
Glucose, Bld: 78 mg/dL (ref 70–99)
Potassium: 3.9 mmol/L (ref 3.5–5.1)
Sodium: 137 mmol/L (ref 135–145)

## 2021-11-12 LAB — URINALYSIS, ROUTINE W REFLEX MICROSCOPIC
Bilirubin Urine: NEGATIVE
Glucose, UA: NEGATIVE mg/dL
Hgb urine dipstick: NEGATIVE
Ketones, ur: 80 mg/dL — AB
Leukocytes,Ua: NEGATIVE
Nitrite: NEGATIVE
Protein, ur: NEGATIVE mg/dL
Specific Gravity, Urine: 1.024 (ref 1.005–1.030)
pH: 6 (ref 5.0–8.0)

## 2021-11-12 MED ORDER — KETOROLAC TROMETHAMINE 30 MG/ML IJ SOLN
30.0000 mg | Freq: Once | INTRAMUSCULAR | Status: AC
  Administered 2021-11-12: 15:00:00 30 mg via INTRAVENOUS
  Filled 2021-11-12: qty 1

## 2021-11-12 MED ORDER — NAPROXEN 500 MG PO TABS: 500.0000 mg | ORAL_TABLET | Freq: Two times a day (BID) | ORAL | 0 refills | Status: AC

## 2021-11-12 MED ORDER — METHOCARBAMOL 1000 MG/10ML IJ SOLN
500.0000 mg | Freq: Once | INTRAVENOUS | Status: AC
  Administered 2021-11-12: 17:00:00 500 mg via INTRAVENOUS
  Filled 2021-11-12: qty 500
  Filled 2021-11-12: qty 5

## 2021-11-12 MED ORDER — METHOCARBAMOL 500 MG PO TABS: 500.0000 mg | ORAL_TABLET | Freq: Two times a day (BID) | ORAL | 0 refills | Status: AC

## 2021-11-12 NOTE — ED Provider Notes (Signed)
[place] DEPT Provider Note   CSN: [id number] Arrival date & time: 11/12/21  1227     History Chief Complaint  Patient presents with   Back Pain    Todd Mclaughlin is a 25 y.o. male presents to the emergency department with acute onset left-sided low back and flank pain aching him from sleep around 2 AM.  Patient reports he was wrestling with his brothers yesterday but did not injure his back as far as he knows.  Denies back pain like this in the past.  Records were reviewed.  Patient was seen on 11/24 with numerous symptoms and back pain at that time.  He was diagnosed with influenza.  He reports today's back pain is significantly different from previous.  No history of nephrolithiasis.  He reports movement and palpation make his back pain significantly worse.  Denies dysuria, hematuria, nausea or vomiting.  No treatments prior to arrival.  Denies radiation of the pain, numbness, tingling, weakness, loss of bowel or bladder control.  The history is provided by the patient and medical records. No language interpreter was used.      Past Medical History:  Diagnosis Date   Tobacco abuse     Patient Active Problem List   Diagnosis Date Noted   Sepsis due to pneumonia ([place]) 08/23/2021   Back pain 08/23/2021   Tobacco use 08/23/2021   Thrombocytopenia ([place]) 08/23/2021    History reviewed. No pertinent surgical history.     Family History  Problem Relation Age of Onset   Diabetes Cousin     Social History   Tobacco Use   Smoking status: Former    Packs/day: 1.00    Types: Cigarettes   Smokeless tobacco: Never   Tobacco comments:    Smoking 07/2021  Vaping Use   Vaping Use: Never used  Substance Use Topics   Alcohol use: No   Drug use: Yes    Types: Marijuana    Home Medications Prior to Admission medications   Medication Sig Start Date End Date Taking? Authorizing Provider  methocarbamol (ROBAXIN) 500 MG tablet Take 1 tablet  (500 mg total) by mouth 2 (two) times daily. 11/12/21  Yes Ramsie Ostrander, Jarrett Soho, PA-C  naproxen (NAPROSYN) 500 MG tablet Take 1 tablet (500 mg total) by mouth 2 (two) times daily with a meal. 11/12/21  Yes Daymond Cordts, Jarrett Soho, PA-C  albuterol (VENTOLIN HFA) 108 (90 Base) MCG/ACT inhaler Inhale 2 puffs into the lungs every 6 (six) hours as needed for wheezing or shortness of breath. Patient not taking: Reported on 11/12/2021 08/25/21   Raiford Noble Latif, DO  benzonatate (TESSALON PERLES) 100 MG capsule Take 1 capsule (100 mg total) by mouth 3 (three) times daily as needed for cough. Patient not taking: Reported on 11/12/2021 08/25/21 08/25/22  Raiford Noble Latif, DO  lidocaine (LIDODERM) 5 % Place 1 patch onto the skin daily. Remove & Discard patch within 12 hours or as directed by MD Patient not taking: Reported on 11/12/2021 08/25/21   Kerney Elbe, DO    Allergies    Patient has no known allergies.  Review of Systems   Review of Systems  Constitutional:  Negative for appetite change, diaphoresis, fatigue, fever and unexpected weight change.  HENT:  Negative for mouth sores.   Eyes:  Negative for visual disturbance.  Respiratory:  Negative for cough, chest tightness, shortness of breath and wheezing.   Cardiovascular:  Negative for chest pain.  Gastrointestinal:  Negative for abdominal pain, constipation, diarrhea, nausea  and vomiting.  Endocrine: Negative for polydipsia, polyphagia and polyuria.  Genitourinary:  Positive for flank pain. Negative for dysuria, frequency, hematuria and urgency.  Musculoskeletal:  Positive for back pain. Negative for neck stiffness.  Skin:  Negative for rash.  Allergic/Immunologic: Negative for immunocompromised state.  Neurological:  Negative for syncope, light-headedness and headaches.  Hematological:  Does not bruise/bleed easily.  Psychiatric/Behavioral:  Negative for sleep disturbance. The patient is not nervous/anxious.    Physical Exam Updated  Vital Signs BP 99/81   Pulse 81   Temp 98.2 F (36.8 C) (Oral)   Resp 16   SpO2 99%   Physical Exam Vitals and nursing note reviewed.  Constitutional:      General: He is not in acute distress.    Appearance: He is well-developed. He is not diaphoretic.  HENT:     Head: Normocephalic and atraumatic.     Mouth/Throat:     Pharynx: No oropharyngeal exudate.  Eyes:     Conjunctiva/sclera: Conjunctivae normal.  Neck:     Comments: Full ROM without pain Cardiovascular:     Rate and Rhythm: Normal rate and regular rhythm.  Pulmonary:     Effort: Pulmonary effort is normal. No respiratory distress.  Abdominal:     General: There is no distension.     Palpations: Abdomen is soft.     Tenderness: There is no abdominal tenderness.  Musculoskeletal:     Cervical back: Normal, normal range of motion and neck supple.     Thoracic back: Tenderness present. No bony tenderness. Normal range of motion.     Lumbar back: Tenderness present. No bony tenderness. Normal range of motion.     Comments: Full range of motion of the T-spine and L-spine with left sided pain No midline tenderness to the  T-spine or L-spine Tenderness to palpation of the left paraspinous muscles of the T-spine and L-spine   Lymphadenopathy:     Cervical: No cervical adenopathy.  Skin:    General: Skin is warm and dry.     Findings: No erythema or rash.  Neurological:     Mental Status: He is alert.     Comments: Speech is clear and goal oriented, follows commands Normal 5/5 strength in upper and lower extremities bilaterally including dorsiflexion and plantar flexion, strong and equal grip strength Sensation normal to light and sharp touch Moves extremities without ataxia, coordination intact Normal gait Normal balance No Clonus   Psychiatric:        Behavior: Behavior normal.    ED Results / Procedures / Treatments   Labs (all labs ordered are listed, but only abnormal results are displayed) Labs  Reviewed  URINALYSIS, ROUTINE W REFLEX MICROSCOPIC - Abnormal; Notable for the following components:      Result Value   Ketones, ur 80 (*)    All other components within normal limits  CBC  BASIC METABOLIC PANEL     Radiology DG Thoracic Spine 2 View  Result Date: 11/12/2021 CLINICAL DATA:  Left flank pain EXAM: THORACIC SPINE 2 VIEWS COMPARISON:  None. FINDINGS: There is no evidence of thoracic spine fracture. Alignment is normal. No other significant bone abnormalities are identified. IMPRESSION: Negative. Electronically Signed   By: Ofilia Neas M.D.   On: 11/12/2021 13:59   DG Lumbar Spine Complete  Result Date: 11/12/2021 CLINICAL DATA:  Low back pain beginning last night. EXAM: LUMBAR SPINE - COMPLETE 4+ VIEW COMPARISON:  None. FINDINGS: There is no evidence of lumbar spine fracture. Alignment  is normal. Intervertebral disc spaces are maintained. Incidental note is made of spina bifida occulta at S1. IMPRESSION: Negative. Electronically Signed   By: Danae Orleans M.D.   On: 11/12/2021 13:41    Procedures Procedures   Medications Ordered in ED Medications  ketorolac (TORADOL) 30 MG/ML injection 30 mg (30 mg Intravenous Given 11/12/21 1507)  methocarbamol (ROBAXIN) 500 mg in dextrose 5 % 50 mL IVPB (0 mg Intravenous Stopped 11/12/21 1725)    ED Course  I have reviewed the triage vital signs and the nursing notes.  Pertinent labs & imaging results that were available during my care of the patient were reviewed by me and considered in my medical decision making (see chart for details).  Clinical Course as of 11/12/21 1725  Sun Nov 12, 2021  1715 Ketones, ur(!): 89 Noted - pt is eating and drinking without difficulty; denies N/V [HM]    Clinical Course User Index [HM] Annaston Upham, Boyd Kerbs   MDM Rules/Calculators/A&P                           Presents with left-sided back and flank pain.  No history of nephrolithiasis.  Denies urinary symptoms.  Afebrile.   Denies IV drug use.  Reports he was wrestling with his brothers yesterday but denies known trauma.  Did not have pain at that time.  Images and pain control pending.  5:23 PM Patient reports he is feeling significantly better.  Has been ambulatory in the hallway without difficulty and with steady gait.  Labs are overall reassuring.  No elevation in serum creatinine.  Urinalysis does show some ketones but patient is clinically not dehydrated.  No hemoglobin to suggest nephrolithiasis.  Patient's pain continues to be fully reproducible.  Suspect musculoskeletal etiology.  Will treat as such.  Discussed close follow-up with primary care and reasons to return to the emergency department.  Patient states understanding and is in agreement with the plan.  BP 108/62   Pulse (!) 55   Temp 98.2 F (36.8 C) (Oral)   Resp 16   SpO2 96%   Final Clinical Impression(s) / ED Diagnoses Final diagnoses:  Acute left-sided thoracic back pain    Rx / DC Orders ED Discharge Orders          Ordered    naproxen (NAPROSYN) 500 MG tablet  2 times daily with meals        11/12/21 1713    methocarbamol (ROBAXIN) 500 MG tablet  2 times daily        11/12/21 1713             Eithan Beagle, Dahlia Client, PA-C 11/12/21 1725    Sloan Leiter, DO 11/15/21 2355

## 2021-11-12 NOTE — ED Triage Notes (Signed)
Pt reports lower back pain since last night. Pt reports it is mainly on left side. Pt reports wrestling w/ little brothers last night and when he laid down it began to hurt.

## 2021-11-12 NOTE — Discharge Instructions (Addendum)

## 2023-03-24 IMAGING — CR DG LUMBAR SPINE COMPLETE 4+V
5 series · 5 of 5 positions shown · non-contrast
Comparison: None.

CLINICAL DATA: Low back pain beginning last night.

EXAM:
LUMBAR SPINE - COMPLETE 4+ VIEW

[t lumbar spine ap]
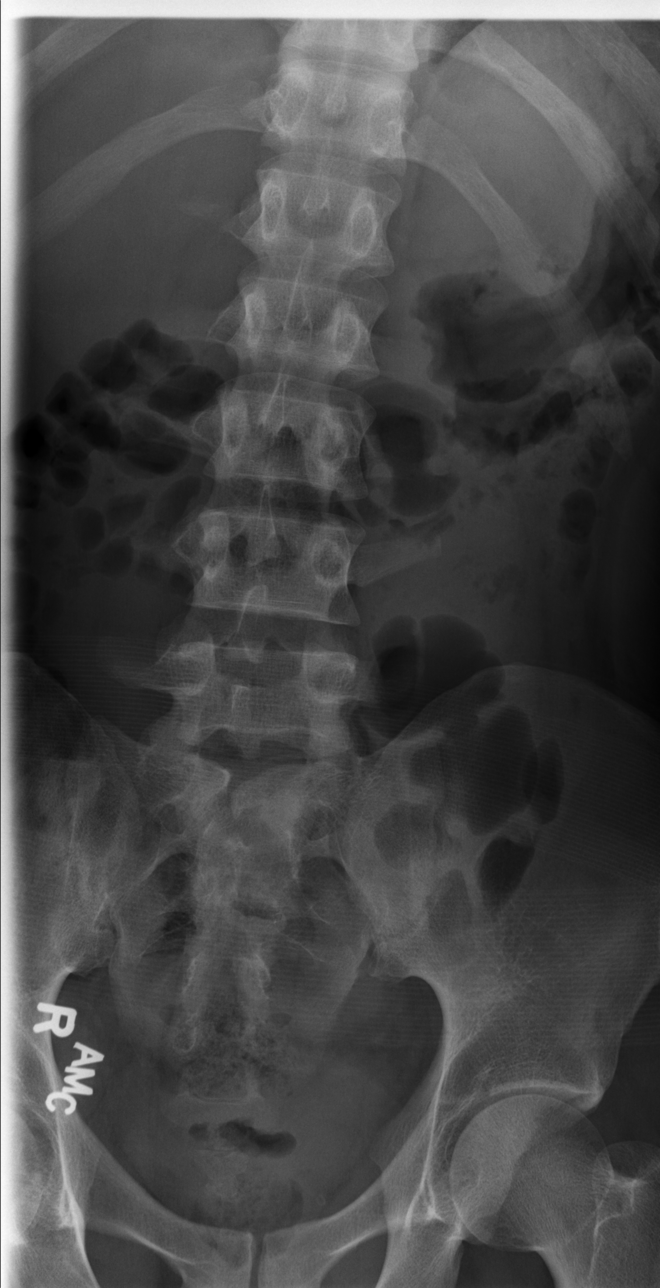

[t lumbar spine obl (1 of 2)]
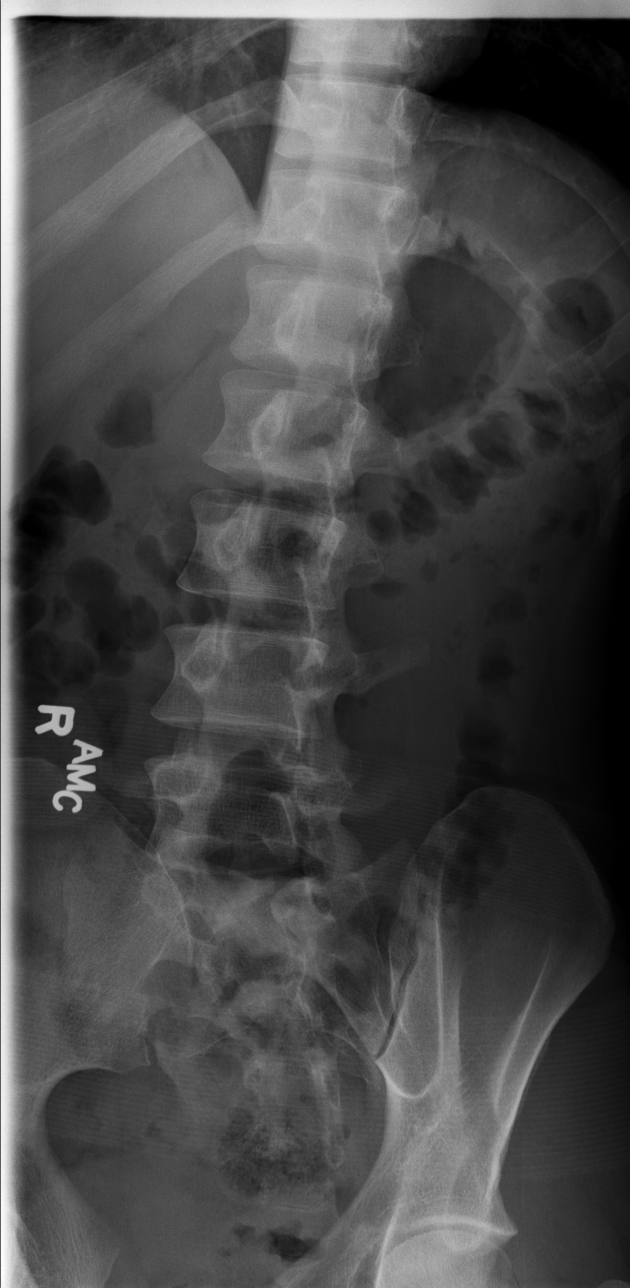

[t lumbar spine obl (2 of 2)]
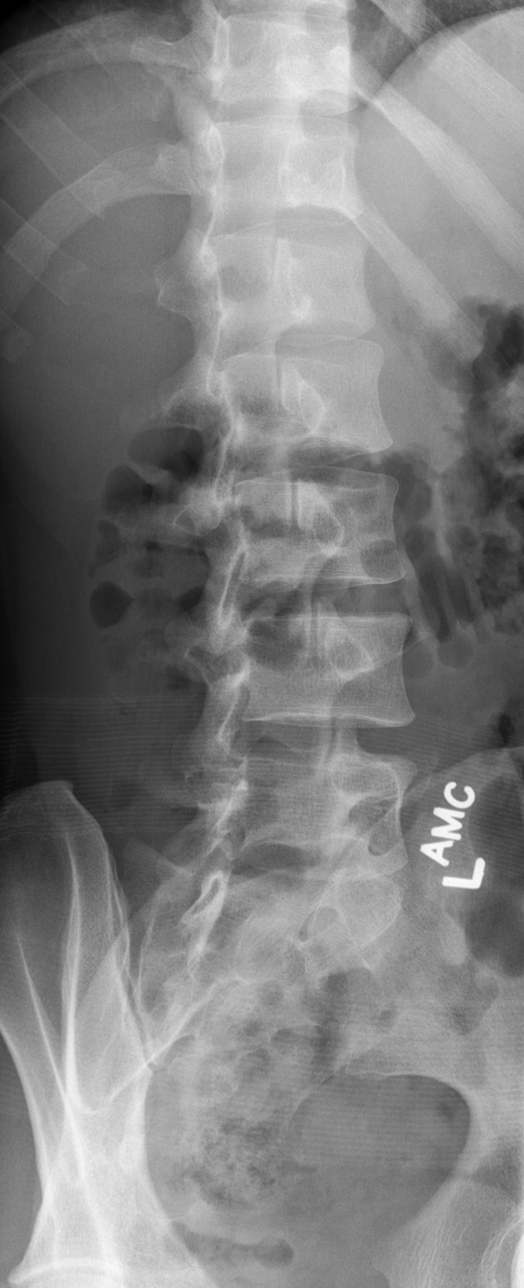

[t lumbar spine lat]
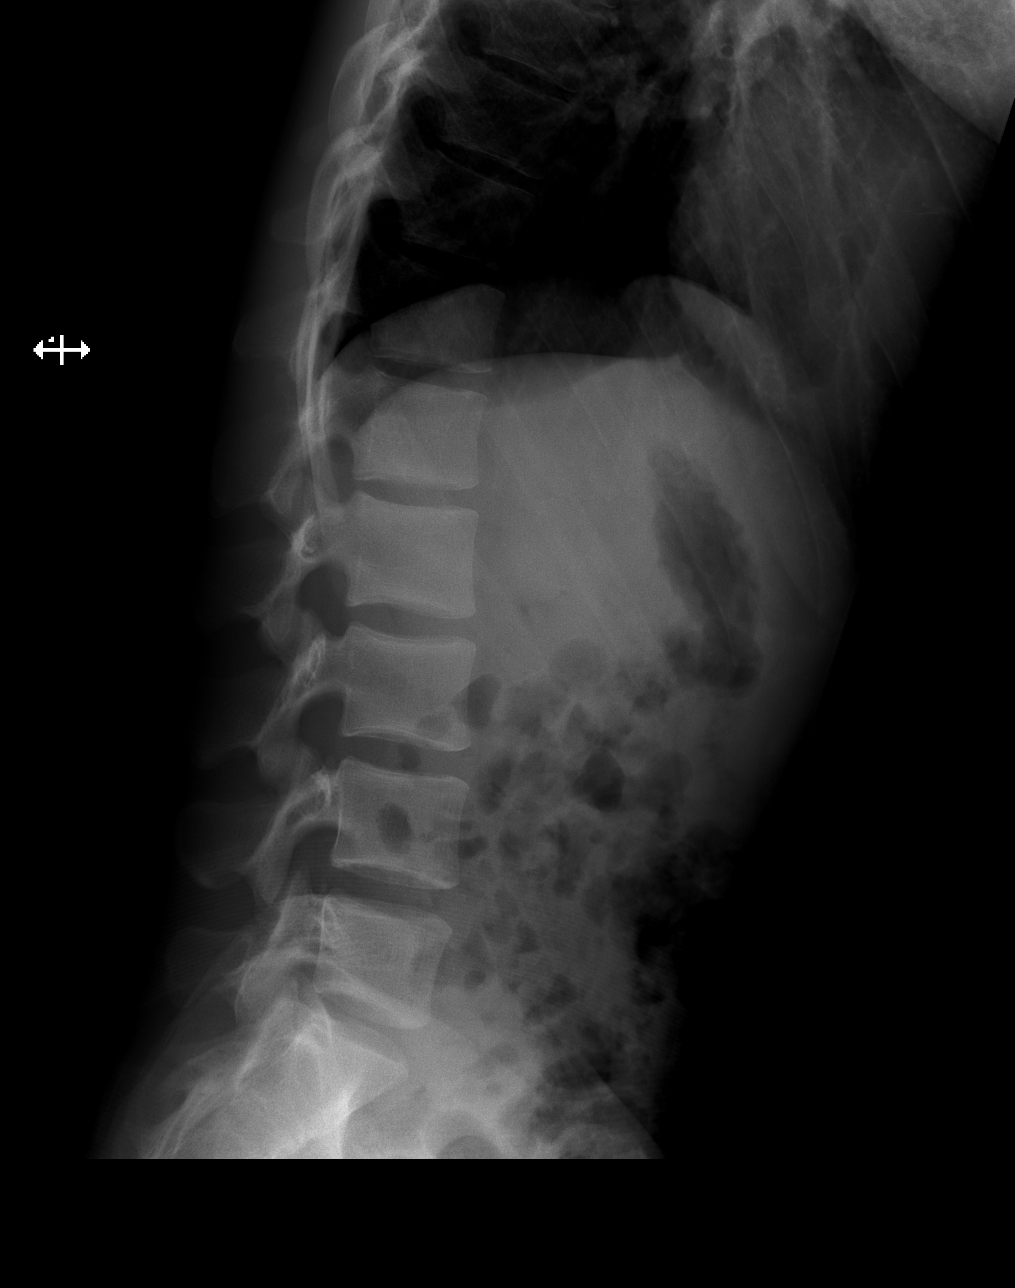

[t lumbar l-5 s-1 spot]
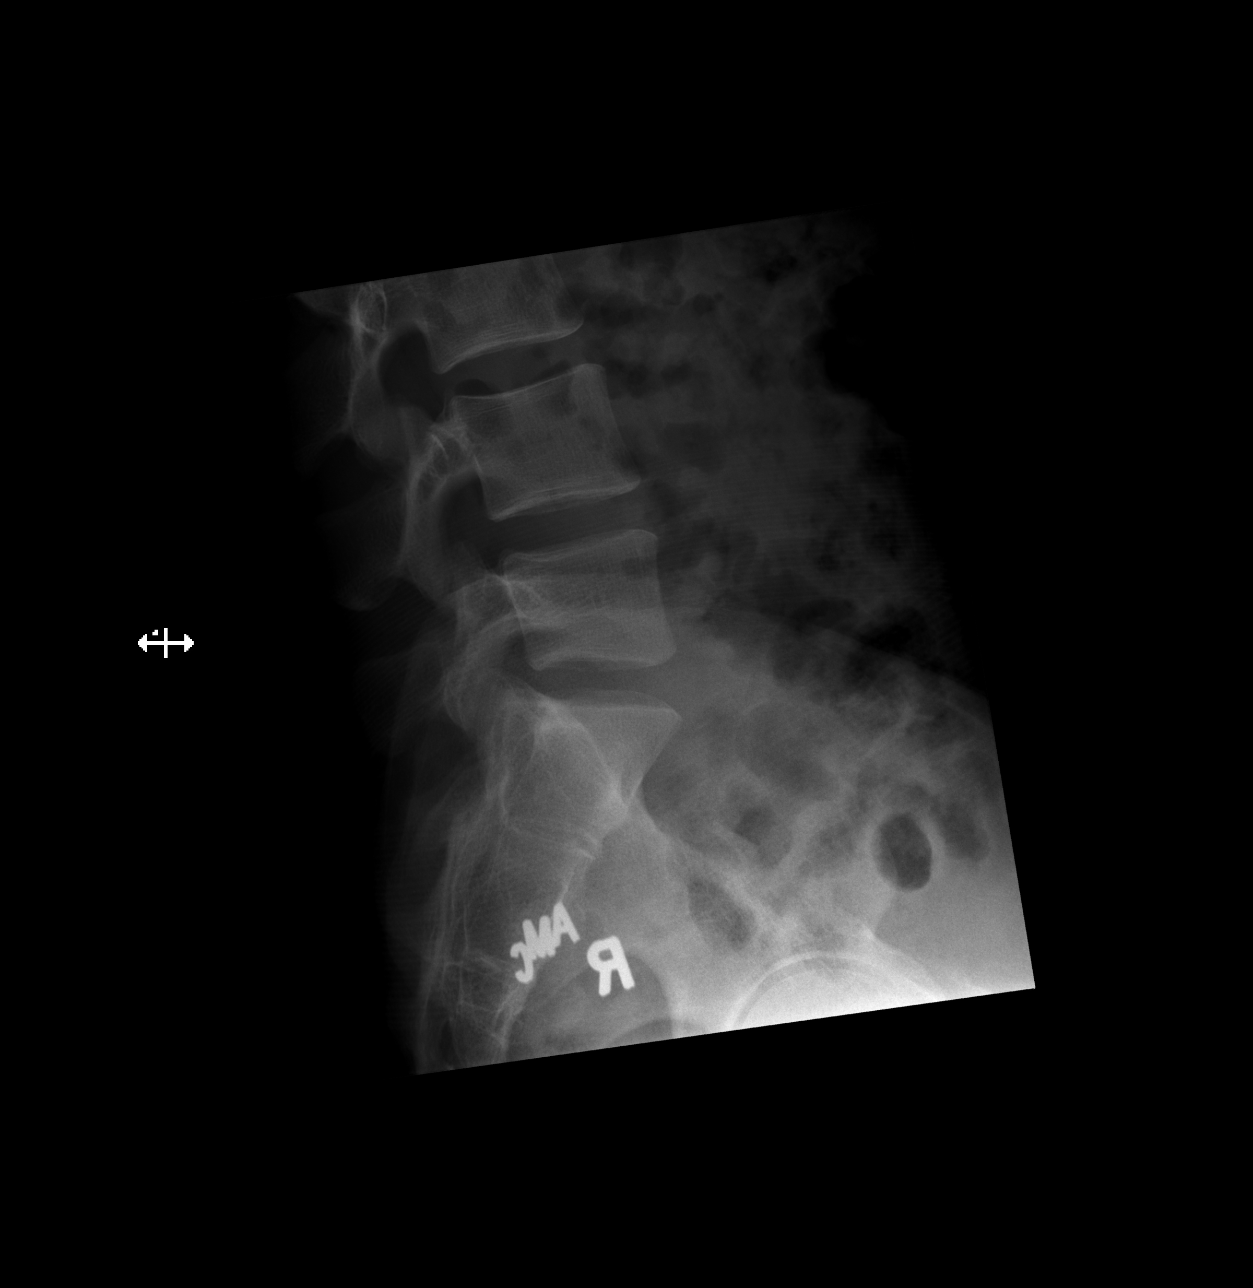

[5 of 5 positions shown; findings below may reference images not displayed]

FINDINGS: There is no evidence of lumbar spine fracture. Alignment is normal.
Intervertebral disc spaces are maintained. Incidental note is made
of spina bifida occulta at S1.
IMPRESSION: Negative.

## 2023-11-25 ENCOUNTER — Other Ambulatory Visit: Payer: Self-pay

## 2023-11-25 ENCOUNTER — Ambulatory Visit (HOSPITAL_COMMUNITY)
Admission: EM | Admit: 2023-11-25 | Discharge: 2023-11-25 | Disposition: A | Discharge status: Home / Self Care | Payer: 59 | Attending: Family Medicine | Admitting: Family Medicine

## 2023-11-25 ENCOUNTER — Encounter (HOSPITAL_COMMUNITY): Payer: Self-pay | Admitting: *Deleted

## 2023-11-25 DIAGNOSIS — Z202 Contact with and (suspected) exposure to infections with a predominantly sexual mode of transmission: Secondary | ICD-10-CM | POA: Diagnosis not present

## 2023-11-25 NOTE — ED Provider Notes (Signed)
[facility]    CSN: [id number] Arrival date & time: 11/25/23  1012      History   Chief Complaint Chief Complaint  Patient presents with   SEXUALLY TRANSMITTED DISEASE    HPI Todd Mclaughlin is a 27 y.o. male.   HPI Here for STD testing.  He is not having any symptoms, including no penile discharge or itching or dysuria.  No fever or abdominal pain  No allergies to medications  Past Medical History:  Diagnosis Date   Tobacco abuse     Patient Active Problem List   Diagnosis Date Noted   Sepsis due to pneumonia (HCC) 08/23/2021   Back pain 08/23/2021   Tobacco use 08/23/2021   Thrombocytopenia (HCC) 08/23/2021    History reviewed. No pertinent surgical history.     Home Medications    Prior to Admission medications   Medication Sig Start Date End Date Taking? Authorizing Provider  albuterol (VENTOLIN HFA) 108 (90 Base) MCG/ACT inhaler Inhale 2 puffs into the lungs every 6 (six) hours as needed for wheezing or shortness of breath. Patient not taking: Reported on 11/12/2021 08/25/21   Marguerita Merles Latif, DO  lidocaine (LIDODERM) 5 % Place 1 patch onto the skin daily. Remove & Discard patch within 12 hours or as directed by MD Patient not taking: Reported on 11/12/2021 08/25/21   Marguerita Merles Latif, DO  methocarbamol (ROBAXIN) 500 MG tablet Take 1 tablet (500 mg total) by mouth 2 (two) times daily. 11/12/21   Muthersbaugh, Dahlia Client, PA-C  naproxen (NAPROSYN) 500 MG tablet Take 1 tablet (500 mg total) by mouth 2 (two) times daily with a meal. 11/12/21   Muthersbaugh, Dahlia Client, PA-C    Family History Family History  Problem Relation Age of Onset   Diabetes Cousin     Social History Social History   Tobacco Use   Smoking status: Former    Current packs/day: 1.00    Types: Cigarettes   Smokeless tobacco: Never   Tobacco comments:    Smoking 07/2021  Vaping Use   Vaping status: Never Used  Substance Use Topics   Alcohol use: No   Drug use: Yes     Types: Marijuana     Allergies   Patient has no known allergies.   Review of Systems Review of Systems   Physical Exam Triage Vital Signs ED Triage Vitals  Encounter Vitals Group     BP 11/25/23 1110 102/68     Systolic BP Percentile --      Diastolic BP Percentile --      Pulse Rate 11/25/23 1110 86     Resp 11/25/23 1110 20     Temp 11/25/23 1110 98 F (36.7 C)     Temp src --      SpO2 11/25/23 1110 98 %     Weight --      Height --      Head Circumference --      Peak Flow --      Pain Score 11/25/23 1108 0     Pain Loc --      Pain Education --      Exclude from Growth Chart --    No data found.  Updated Vital Signs BP 102/68   Pulse 86   Temp 98 F (36.7 C)   Resp 20   SpO2 98%   Visual Acuity Right Eye Distance:   Left Eye Distance:   Bilateral Distance:    Right Eye Near:  Left Eye Near:    Bilateral Near:     Physical Exam Vitals reviewed.  Constitutional:      General: He is not in acute distress.    Appearance: He is not toxic-appearing.  Skin:    Coloration: Skin is not pale.  Neurological:     Mental Status: He is alert and oriented to person, place, and time.  Psychiatric:        Behavior: Behavior normal.      UC Treatments / Results  Labs (all labs ordered are listed, but only abnormal results are displayed) Labs Reviewed  RPR  HIV ANTIBODY (ROUTINE TESTING W REFLEX)  CYTOLOGY, (ORAL, ANAL, URETHRAL) ANCILLARY ONLY    EKG   Radiology No results found.  Procedures Procedures (including critical care time)  Medications Ordered in UC Medications - No data to display  Initial Impression / Assessment and Plan / UC Course  I have reviewed the triage vital signs and the nursing notes.  Pertinent labs & imaging results that were available during my care of the patient were reviewed by me and considered in my medical decision making (see chart for details).     Lab is drawn to check HIV and RPR. Urethral self  swab is done and staff will notify him of any positives on that or the bloodwork, and treat per protocol.  Final Clinical Impressions(s) / UC Diagnoses   Final diagnoses:  Exposure to STD     Discharge Instructions      Staff will notify you if there is anything positive on the swab or blood work.      ED Prescriptions   None    PDMP not reviewed this encounter.   Zenia Resides, MD 11/25/23 [phone]

## 2023-11-25 NOTE — ED Triage Notes (Signed)
Pt wants a STD check with no Sx's or known contact.

## 2023-11-25 NOTE — Discharge Instructions (Signed)
Staff will notify you if there is anything positive on the swab or blood work.

## 2023-11-26 LAB — CYTOLOGY, (ORAL, ANAL, URETHRAL) ANCILLARY ONLY
Chlamydia: NEGATIVE
Comment: NEGATIVE
Comment: NEGATIVE
Comment: NORMAL
Neisseria Gonorrhea: NEGATIVE
Trichomonas: POSITIVE — AB

## 2023-11-26 LAB — RPR: RPR Ser Ql: NONREACTIVE

## 2023-11-26 LAB — HIV ANTIBODY (ROUTINE TESTING W REFLEX): HIV Screen 4th Generation wRfx: NONREACTIVE

## 2023-11-27 ENCOUNTER — Telehealth (HOSPITAL_COMMUNITY): Payer: Self-pay

## 2023-11-27 MED ORDER — METRONIDAZOLE 500 MG PO TABS: 2000.0000 mg | ORAL_TABLET | Freq: Once | ORAL | 0 refills | Status: AC

## 2023-11-27 NOTE — Telephone Encounter (Signed)
Per protocol, pt requires tx with metronidazole. Attempted to reach patient x1. VM full.  Rx sent to pharmacy on file.

## 2023-12-07 ENCOUNTER — Other Ambulatory Visit: Payer: Self-pay

## 2023-12-07 ENCOUNTER — Ambulatory Visit (HOSPITAL_COMMUNITY)
Admission: EM | Admit: 2023-12-07 | Discharge: 2023-12-07 | Disposition: A | Discharge status: Home / Self Care | Payer: 59 | Attending: Family Medicine | Admitting: Family Medicine

## 2023-12-07 DIAGNOSIS — Z202 Contact with and (suspected) exposure to infections with a predominantly sexual mode of transmission: Secondary | ICD-10-CM | POA: Diagnosis not present

## 2023-12-07 NOTE — Discharge Instructions (Signed)
Staff will notify you if there is anything positive on the swab

## 2023-12-07 NOTE — ED Provider Notes (Signed)
[facility]    CSN: [id number] Arrival date & time: 12/07/23  1246      History   Chief Complaint Chief Complaint  Patient presents with   Exposure to STD   Follow-up    HPI Todd Mclaughlin is a 27 y.o. male.    Exposure to STD  Here for retesting.  He was seen here December 9 and did not have symptoms at the time, but he tested positive for trichomonas  He took his 4 pills of metronidazole on 11 December.  He threw up within about an hour after taking them.  He had understood that he could have retesting done at 1 week.  Today is about 10 days from when he took the treatment.  Past Medical History:  Diagnosis Date   Tobacco abuse     Patient Active Problem List   Diagnosis Date Noted   Sepsis due to pneumonia (HCC) 08/23/2021   Back pain 08/23/2021   Tobacco use 08/23/2021   Thrombocytopenia (HCC) 08/23/2021    No past surgical history on file.     Home Medications    Prior to Admission medications   Medication Sig Start Date End Date Taking? Authorizing Provider  albuterol (VENTOLIN HFA) 108 (90 Base) MCG/ACT inhaler Inhale 2 puffs into the lungs every 6 (six) hours as needed for wheezing or shortness of breath. Patient not taking: Reported on 11/12/2021 08/25/21   Marguerita Merles Latif, DO  lidocaine (LIDODERM) 5 % Place 1 patch onto the skin daily. Remove & Discard patch within 12 hours or as directed by MD Patient not taking: Reported on 11/12/2021 08/25/21   Marguerita Merles Latif, DO  methocarbamol (ROBAXIN) 500 MG tablet Take 1 tablet (500 mg total) by mouth 2 (two) times daily. 11/12/21   Muthersbaugh, Dahlia Client, PA-C  naproxen (NAPROSYN) 500 MG tablet Take 1 tablet (500 mg total) by mouth 2 (two) times daily with a meal. 11/12/21   Muthersbaugh, Dahlia Client, PA-C    Family History Family History  Problem Relation Age of Onset   Diabetes Cousin     Social History Social History   Tobacco Use   Smoking status: Former    Current  packs/day: 1.00    Types: Cigarettes   Smokeless tobacco: Never   Tobacco comments:    Smoking 07/2021  Vaping Use   Vaping status: Never Used  Substance Use Topics   Alcohol use: No   Drug use: Yes    Types: Marijuana     Allergies   Patient has no known allergies.   Review of Systems Review of Systems   Physical Exam Triage Vital Signs ED Triage Vitals  Encounter Vitals Group     BP 12/07/23 1257 119/75     Systolic BP Percentile --      Diastolic BP Percentile --      Pulse Rate 12/07/23 1257 92     Resp 12/07/23 1257 20     Temp 12/07/23 1257 97.6 F (36.4 C)     Temp Source 12/07/23 1257 Oral     SpO2 12/07/23 1256 95 %     Weight 12/07/23 1256 150 lb (68 kg)     Height 12/07/23 1256 5\' 7"  (1.702 m)     Head Circumference --      Peak Flow --      Pain Score 12/07/23 1256 0     Pain Loc --      Pain Education --      Exclude from [employer]  Chart --    No data found.  Updated Vital Signs BP 119/75 (BP Location: Right Arm)   Pulse 92   Temp 97.6 F (36.4 C) (Oral)   Resp 20   Ht 5\' 7"  (1.702 m)   Wt 68 kg   SpO2 96%   BMI 23.49 kg/m   Visual Acuity Right Eye Distance:   Left Eye Distance:   Bilateral Distance:    Right Eye Near:   Left Eye Near:    Bilateral Near:     Physical Exam Vitals reviewed.  Constitutional:      General: He is not in acute distress.    Appearance: He is not toxic-appearing.  Skin:    Coloration: Skin is not jaundiced or pale.  Neurological:     Mental Status: He is alert and oriented to person, place, and time.  Psychiatric:        Behavior: Behavior normal.      UC Treatments / Results  Labs (all labs ordered are listed, but only abnormal results are displayed) Labs Reviewed - No data to display  EKG   Radiology No results found.  Procedures Procedures (including critical care time)  Medications Ordered in UC Medications - No data to display  Initial Impression / Assessment and Plan / UC Course   I have reviewed the triage vital signs and the nursing notes.  Pertinent labs & imaging results that were available during my care of the patient were reviewed by me and considered in my medical decision making (see chart for details).     Since he threw up within an hour of when he took the metronidazole, I have some concern that he is only partially treated.  I did discuss with him that it would be ideal to wait 2 weeks after treatment was done to be retested.  We decided to go ahead and he did the swab today.  We will notify him and treat per protocol.  He may actually do better though, with the twice a day treatment for 1 week.   Final Clinical Impressions(s) / UC Diagnoses   Final diagnoses:  None   Discharge Instructions   None    ED Prescriptions   None    PDMP not reviewed this encounter.   Zenia Resides, MD 12/07/23 1311

## 2023-12-07 NOTE — ED Triage Notes (Signed)
Patient arrives to follow-up regarding STD. Patient tested positive for Trich and completed course of antibiotics. Would like to be rechecked.  Not symptomatic.

## 2023-12-09 ENCOUNTER — Telehealth (HOSPITAL_COMMUNITY): Payer: Self-pay

## 2023-12-09 MED ORDER — METRONIDAZOLE 500 MG PO TABS: 2000.0000 mg | ORAL_TABLET | Freq: Once | ORAL | 0 refills | Status: AC

## 2023-12-09 NOTE — Telephone Encounter (Signed)
 Per protocol, pt requires tx with metronidazole. Attempted to reach patient x1. LVM. Rx sent to pharmacy on file.

## 2023-12-10 LAB — CYTOLOGY, (ORAL, ANAL, URETHRAL) ANCILLARY ONLY
Chlamydia: NEGATIVE
Comment: NEGATIVE
Comment: NEGATIVE
Comment: NORMAL
Neisseria Gonorrhea: NEGATIVE
Trichomonas: POSITIVE — AB
